# Patient Record
Sex: Female | Born: 1965
Health system: Southern US, Community
[De-identification: ages and names within clinical notes are randomized; demographics above are authoritative.]

## PROBLEM LIST (undated history)

## (undated) DIAGNOSIS — R200 Anesthesia of skin: Secondary | ICD-10-CM

## (undated) DIAGNOSIS — D649 Anemia, unspecified: Secondary | ICD-10-CM

## (undated) DIAGNOSIS — R519 Headache, unspecified: Secondary | ICD-10-CM

## (undated) DIAGNOSIS — J45909 Unspecified asthma, uncomplicated: Secondary | ICD-10-CM

## (undated) DIAGNOSIS — R232 Flushing: Secondary | ICD-10-CM

## (undated) DIAGNOSIS — N951 Menopausal and female climacteric states: Secondary | ICD-10-CM

## (undated) DIAGNOSIS — C439 Malignant melanoma of skin, unspecified: Secondary | ICD-10-CM

## (undated) DIAGNOSIS — N83209 Unspecified ovarian cyst, unspecified side: Secondary | ICD-10-CM

## (undated) DIAGNOSIS — R9389 Abnormal findings on diagnostic imaging of other specified body structures: Secondary | ICD-10-CM

## (undated) DIAGNOSIS — D219 Benign neoplasm of connective and other soft tissue, unspecified: Secondary | ICD-10-CM

## (undated) DIAGNOSIS — R51 Headache: Secondary | ICD-10-CM

## (undated) DIAGNOSIS — E7800 Pure hypercholesterolemia, unspecified: Secondary | ICD-10-CM

## (undated) DIAGNOSIS — N926 Irregular menstruation, unspecified: Secondary | ICD-10-CM

## (undated) DIAGNOSIS — J40 Bronchitis, not specified as acute or chronic: Secondary | ICD-10-CM

## (undated) DIAGNOSIS — R87810 Cervical high risk human papillomavirus (HPV) DNA test positive: Secondary | ICD-10-CM

## (undated) HISTORY — DX: Malignant melanoma of skin, unspecified: C43.9

## (undated) HISTORY — PX: TUBAL LIGATION: SHX77

## (undated) HISTORY — DX: Flushing: R23.2

## (undated) HISTORY — DX: Cervical high risk human papillomavirus (HPV) DNA test positive: R87.810

## (undated) HISTORY — DX: Pure hypercholesterolemia, unspecified: E78.00

## (undated) HISTORY — DX: Abnormal findings on diagnostic imaging of other specified body structures: R93.89

## (undated) HISTORY — DX: Benign neoplasm of connective and other soft tissue, unspecified: D21.9

## (undated) HISTORY — DX: Menopausal and female climacteric states: N95.1

## (undated) HISTORY — DX: Anemia, unspecified: D64.9

## (undated) HISTORY — DX: Irregular menstruation, unspecified: N92.6

## (undated) HISTORY — DX: Bronchitis, not specified as acute or chronic: J40

## (undated) HISTORY — DX: Unspecified ovarian cyst, unspecified side: N83.209

---

## 2008-12-16 ENCOUNTER — Emergency Department (HOSPITAL_COMMUNITY): Admission: EM | Admit: 2008-12-16 | Discharge: 2008-12-16 | Payer: Self-pay | Admitting: Emergency Medicine

## 2009-10-18 ENCOUNTER — Other Ambulatory Visit: Admission: RE | Admit: 2009-10-18 | Discharge: 2009-10-18 | Payer: Self-pay | Admitting: Obstetrics and Gynecology

## 2009-10-25 ENCOUNTER — Ambulatory Visit (HOSPITAL_COMMUNITY): Admission: RE | Admit: 2009-10-25 | Discharge: 2009-10-25 | Payer: Self-pay | Admitting: Obstetrics & Gynecology

## 2011-03-18 LAB — URINALYSIS, ROUTINE W REFLEX MICROSCOPIC
Bilirubin Urine: NEGATIVE
Glucose, UA: NEGATIVE mg/dL
Nitrite: POSITIVE — AB
pH: 6.5 (ref 5.0–8.0)

## 2011-03-18 LAB — URINE CULTURE

## 2012-12-02 HISTORY — PX: SKIN CANCER EXCISION: SHX779

## 2014-12-15 ENCOUNTER — Encounter: Payer: Self-pay | Admitting: Adult Health

## 2014-12-15 ENCOUNTER — Ambulatory Visit (INDEPENDENT_AMBULATORY_CARE_PROVIDER_SITE_OTHER): Payer: BLUE CROSS/BLUE SHIELD | Admitting: Adult Health

## 2014-12-15 ENCOUNTER — Other Ambulatory Visit (HOSPITAL_COMMUNITY)
Admission: RE | Admit: 2014-12-15 | Discharge: 2014-12-15 | Disposition: A | Discharge status: Home / Self Care | Payer: BLUE CROSS/BLUE SHIELD | Source: Ambulatory Visit | Attending: Adult Health | Admitting: Adult Health

## 2014-12-15 VITALS — BP 130/60 | HR 76 | Ht 65.25 in | Wt 150.5 lb

## 2014-12-15 DIAGNOSIS — N926 Irregular menstruation, unspecified: Secondary | ICD-10-CM

## 2014-12-15 DIAGNOSIS — R232 Flushing: Secondary | ICD-10-CM

## 2014-12-15 DIAGNOSIS — Z01419 Encounter for gynecological examination (general) (routine) without abnormal findings: Secondary | ICD-10-CM

## 2014-12-15 DIAGNOSIS — Z1151 Encounter for screening for human papillomavirus (HPV): Secondary | ICD-10-CM | POA: Insufficient documentation

## 2014-12-15 DIAGNOSIS — Z1212 Encounter for screening for malignant neoplasm of rectum: Secondary | ICD-10-CM

## 2014-12-15 DIAGNOSIS — N951 Menopausal and female climacteric states: Secondary | ICD-10-CM

## 2014-12-15 DIAGNOSIS — R635 Abnormal weight gain: Secondary | ICD-10-CM | POA: Insufficient documentation

## 2014-12-15 HISTORY — DX: Irregular menstruation, unspecified: N92.6

## 2014-12-15 HISTORY — DX: Menopausal and female climacteric states: N95.1

## 2014-12-15 HISTORY — DX: Flushing: R23.2

## 2014-12-15 LAB — HEMOCCULT GUIAC POC 1CARD (OFFICE): Fecal Occult Blood, POC: NEGATIVE

## 2014-12-15 NOTE — Patient Instructions (Signed)
Get mammogram [phone] Physical in 1 year Colonoscopy advised at 49 Menopause Menopause is the normal time of life when menstrual periods stop completely. Menopause is complete when you have missed 12 consecutive menstrual periods. It usually occurs between the ages of 50 years and 66 years. Very rarely does a woman develop menopause before the age of 50 years. At menopause, your ovaries stop producing the female hormones estrogen and progesterone. This can cause undesirable symptoms and also affect your health. Sometimes the symptoms may occur 4-5 years before the menopause begins. There is no relationship between menopause and:  Oral contraceptives.  Number of children you had.  Race.  The age your menstrual periods started (menarche). Heavy smokers and very thin women may develop menopause earlier in life. CAUSES  The ovaries stop producing the female hormones estrogen and progesterone.  Other causes include:  Surgery to remove both ovaries.  The ovaries stop functioning for no known reason.  Tumors of the pituitary gland in the brain.  Medical disease that affects the ovaries and hormone production.  Radiation treatment to the abdomen or pelvis.  Chemotherapy that affects the ovaries. SYMPTOMS   Hot flashes.  Night sweats.  Decrease in sex drive.  Vaginal dryness and thinning of the vagina causing painful intercourse.  Dryness of the skin and developing wrinkles.  Headaches.  Tiredness.  Irritability.  Memory problems.  Weight gain.  Bladder infections.  Hair growth of the face and chest.  Infertility. More serious symptoms include:  Loss of bone (osteoporosis) causing breaks (fractures).  Depression.  Hardening and narrowing of the arteries (atherosclerosis) causing heart attacks and strokes. DIAGNOSIS   When the menstrual periods have stopped for 12 straight months.  Physical exam.  Hormone studies of the blood. TREATMENT  There are many  treatment choices and nearly as many questions about them. The decisions to treat or not to treat menopausal changes is an individual choice made with your health care provider. Your health care provider can discuss the treatments with you. Together, you can decide which treatment will work best for you. Your treatment choices may include:   Hormone therapy (estrogen and progesterone).  Non-hormonal medicines.  Treating the individual symptoms with medicine (for example antidepressants for depression).  Herbal medicines that may help specific symptoms.  Counseling by a psychiatrist or psychologist.  Group therapy.  Lifestyle changes including:  Eating healthy.  Regular exercise.  Limiting caffeine and alcohol.  Stress management and meditation.  No treatment. HOME CARE INSTRUCTIONS   Take the medicine your health care provider gives you as directed.  Get plenty of sleep and rest.  Exercise regularly.  Eat a diet that contains calcium (good for the bones) and soy products (acts like estrogen hormone).  Avoid alcoholic beverages.  Do not smoke.  If you have hot flashes, dress in layers.  Take supplements, calcium, and vitamin D to strengthen bones.  You can use over-the-counter lubricants or moisturizers for vaginal dryness.  Group therapy is sometimes very helpful.  Acupuncture may be helpful in some cases. SEEK MEDICAL CARE IF:   You are not sure you are in menopause.  You are having menopausal symptoms and need advice and treatment.  You are still having menstrual periods after age 50 years.  You have pain with intercourse.  Menopause is complete (no menstrual period for 12 months) and you develop vaginal bleeding.  You need a referral to a specialist (gynecologist, psychiatrist, or psychologist) for treatment. SEEK IMMEDIATE MEDICAL CARE IF:  You have severe depression.  You have excessive vaginal bleeding.  You fell and think you have a broken  bone.  You have pain when you urinate.  You develop leg or chest pain.  You have a fast pounding heart beat (palpitations).  You have severe headaches.  You develop vision problems.  You feel a lump in your breast.  You have abdominal pain or severe indigestion. Document Released: 02/08/2004 Document Revised: 07/21/2013 Document Reviewed: 06/17/2013 [facility] Patient Information 2015 [place], [place]. This information is not intended to replace advice given to you by your health care provider. Make sure you discuss any questions you have with your health care provider. Perimenopause Perimenopause is the time when your body begins to move into the menopause (no menstrual period for 12 straight months). It is a natural process. Perimenopause can begin 2-8 years before the menopause and usually lasts for 1 year after the menopause. During this time, your ovaries may or may not produce an egg. The ovaries vary in their production of estrogen and progesterone hormones each month. This can cause irregular menstrual periods, difficulty getting pregnant, vaginal bleeding between periods, and uncomfortable symptoms. CAUSES  Irregular production of the ovarian hormones, estrogen and progesterone, and not ovulating every month.  Other causes include:  Tumor of the pituitary gland in the brain.  Medical disease that affects the ovaries.  Radiation treatment.  Chemotherapy.  Unknown causes.  Heavy smoking and excessive alcohol intake can bring on perimenopause sooner. SIGNS AND SYMPTOMS   Hot flashes.  Night sweats.  Irregular menstrual periods.  Decreased sex drive.  Vaginal dryness.  Headaches.  Mood swings.  Depression.  Memory problems.  Irritability.  Tiredness.  Weight gain.  Trouble getting pregnant.  The beginning of losing bone cells (osteoporosis).  The beginning of hardening of the arteries (atherosclerosis). DIAGNOSIS  Your health care provider will  make a diagnosis by analyzing your age, menstrual history, and symptoms. He or she will do a physical exam and note any changes in your body, especially your female organs. Female hormone tests may or may not be helpful depending on the amount of female hormones you produce and when you produce them. However, other hormone tests may be helpful to rule out other problems. TREATMENT  In some cases, no treatment is needed. The decision on whether treatment is necessary during the perimenopause should be made by you and your health care provider based on how the symptoms are affecting you and your lifestyle. Various treatments are available, such as:  Treating individual symptoms with a specific medicine for that symptom.  Herbal medicines that can help specific symptoms.  Counseling.  Group therapy. HOME CARE INSTRUCTIONS   Keep track of your menstrual periods (when they occur, how heavy they are, how long between periods, and how long they last) as well as your symptoms and when they started.  Only take over-the-counter or prescription medicines as directed by your health care provider.  Sleep and rest.  Exercise.  Eat a diet that contains calcium (good for your bones) and soy (acts like the estrogen hormone).  Do not smoke.  Avoid alcoholic beverages.  Take vitamin supplements as recommended by your health care provider. Taking vitamin E may help in certain cases.  Take calcium and vitamin D supplements to help prevent bone loss.  Group therapy is sometimes helpful.  Acupuncture may help in some cases. SEEK MEDICAL CARE IF:   You have questions about any symptoms you are having.  You need  a referral to a specialist (gynecologist, psychiatrist, or psychologist). SEEK IMMEDIATE MEDICAL CARE IF:   You have vaginal bleeding.  Your period lasts longer than 8 days.  Your periods are recurring sooner than 21 days.  You have bleeding after intercourse.  You have severe  depression.  You have pain when you urinate.  You have severe headaches.  You have vision problems. Document Released: 12/26/2004 Document Revised: 09/08/2013 Document Reviewed: 06/17/2013 [facility] Patient Information 2015 [place], [place]. This information is not intended to replace advice given to you by your health care provider. Make sure you discuss any questions you have with your health care provider.

## 2014-12-15 NOTE — Progress Notes (Signed)
Patient ID: Nancy Barrett, female   DOB: [birth year], 49 y.o.   MRN: [id number] History of Present Illness: Nancy Barrett is a 49 year old white female, married in for pap and physical and is complaining of hot flashes,weight gain, not sleeping well, some constipation and periods are irregular, will skip and then its heavy with clots.She has headaches more now.   Current Medications, Allergies, Past Medical History, Past Surgical History, Family History and Social History were reviewed in [employer] record.     Review of Systems: Patient denies any  blurred vision, shortness of breath, chest pain, abdominal pain, problems with  urination, or intercourse. 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    Physical Exam:BP 130/60 mmHg  Pulse 76  Ht 5' 5.25" (1.657 m)  Wt 150 lb 8 oz (68.266 kg)  BMI 24.86 kg/m2  LMP 11/21/2014 General:  Well developed, well nourished, no acute distress Skin:  Warm and dry Neck:  Midline trachea, normal thyroid Lungs; Clear to auscultation bilaterally Breast:  No dominant palpable mass, retraction, or nipple discharge,has bilateral irregularities Cardiovascular: Regular rate and rhythm Abdomen:  Soft, non tender, no hepatosplenomegaly Pelvic:  External genitalia is normal in appearance, no lesions.  The vagina is normal in appearance.      The cervix is bulbous, pap with HPV performed.  Uterus is felt to be normal size, shape, and contour.  No  adnexal masses or tenderness noted. Rectal: Good sphincter tone, no polyps, or hemorrhoids felt.  Hemoccult negative. Extremities:  No swelling, some spider veins  noted Psych:  No mood changes,alert and cooperative,seems happy Discussed peri menopause and menopause symptoms.  Impression: Well woman gyn exam with pap Hot flashes Weight gain Irregular periods Peri  menopause    Plan: Check CBC,CMP,TSH,FSH and lipids Return in 1 week for [place] and see me, will review [place] and labs then and discuss options Get mammogram Colonoscopy advised Physical in 1 year Review handout on peri menopause and menopause

## 2014-12-16 LAB — COMPREHENSIVE METABOLIC PANEL
ALT: 11 U/L (ref 0–35)
AST: 17 U/L (ref 0–37)
Albumin: 4.1 g/dL (ref 3.5–5.2)
Alkaline Phosphatase: 81 U/L (ref 39–117)
BILIRUBIN TOTAL: 0.4 mg/dL (ref 0.2–1.2)
BUN: 13 mg/dL (ref 6–23)
CHLORIDE: 103 meq/L (ref 96–112)
CO2: 29 mEq/L (ref 19–32)
Calcium: 10.7 mg/dL — ABNORMAL HIGH (ref 8.4–10.5)
Creat: 0.62 mg/dL (ref 0.50–1.10)
GLUCOSE: 86 mg/dL (ref 70–99)
Potassium: 4.7 mEq/L (ref 3.5–5.3)
SODIUM: 137 meq/L (ref 135–145)
TOTAL PROTEIN: 6.6 g/dL (ref 6.0–8.3)

## 2014-12-16 LAB — LIPID PANEL
CHOLESTEROL: 194 mg/dL (ref 0–200)
HDL: 60 mg/dL (ref >39)
LDL Cholesterol: 123 mg/dL — ABNORMAL HIGH (ref 0–99)
Total CHOL/HDL Ratio: 3.2 Ratio
Triglycerides: 55 mg/dL (ref <150)
VLDL: 11 mg/dL (ref 0–40)

## 2014-12-16 LAB — CBC
HCT: 35.3 % — ABNORMAL LOW (ref 36.0–46.0)
HEMOGLOBIN: 11.4 g/dL — AB (ref 12.0–15.0)
MCH: 26.3 pg (ref 26.0–34.0)
MCHC: 32.3 g/dL (ref 30.0–36.0)
MCV: 81.5 fL (ref 78.0–100.0)
MPV: 10.6 fL (ref 8.6–12.4)
PLATELETS: 237 10*3/uL (ref 150–400)
RBC: 4.33 MIL/uL (ref 3.87–5.11)
RDW: 13.2 % (ref 11.5–15.5)
WBC: 6.1 10*3/uL (ref 4.0–10.5)

## 2014-12-16 LAB — FOLLICLE STIMULATING HORMONE: FSH: 11.1 m[IU]/mL

## 2014-12-16 LAB — TSH: TSH: 2.453 u[IU]/mL (ref 0.350–4.500)

## 2014-12-19 ENCOUNTER — Other Ambulatory Visit: Payer: Self-pay | Admitting: Obstetrics and Gynecology

## 2014-12-19 ENCOUNTER — Telehealth: Payer: Self-pay | Admitting: Adult Health

## 2014-12-19 DIAGNOSIS — Z1231 Encounter for screening mammogram for malignant neoplasm of breast: Secondary | ICD-10-CM

## 2014-12-19 LAB — CYTOLOGY - PAP

## 2014-12-19 NOTE — Telephone Encounter (Signed)
Pt aware of  Labs and pap, take MN with iron keep app 1/25 for [place] and see me

## 2014-12-19 NOTE — Telephone Encounter (Signed)
Opened in error

## 2014-12-26 ENCOUNTER — Ambulatory Visit (INDEPENDENT_AMBULATORY_CARE_PROVIDER_SITE_OTHER): Payer: BLUE CROSS/BLUE SHIELD | Admitting: Adult Health

## 2014-12-26 ENCOUNTER — Other Ambulatory Visit: Payer: Self-pay | Admitting: Adult Health

## 2014-12-26 ENCOUNTER — Encounter: Payer: Self-pay | Admitting: Adult Health

## 2014-12-26 ENCOUNTER — Ambulatory Visit (INDEPENDENT_AMBULATORY_CARE_PROVIDER_SITE_OTHER): Payer: BLUE CROSS/BLUE SHIELD

## 2014-12-26 VITALS — BP 148/90 | Ht 65.25 in | Wt 150.0 lb

## 2014-12-26 DIAGNOSIS — D259 Leiomyoma of uterus, unspecified: Secondary | ICD-10-CM

## 2014-12-26 DIAGNOSIS — N921 Excessive and frequent menstruation with irregular cycle: Secondary | ICD-10-CM

## 2014-12-26 DIAGNOSIS — N83201 Unspecified ovarian cyst, right side: Secondary | ICD-10-CM

## 2014-12-26 DIAGNOSIS — N8320 Unspecified ovarian cysts: Secondary | ICD-10-CM

## 2014-12-26 DIAGNOSIS — N83209 Unspecified ovarian cyst, unspecified side: Secondary | ICD-10-CM

## 2014-12-26 DIAGNOSIS — N926 Irregular menstruation, unspecified: Secondary | ICD-10-CM

## 2014-12-26 DIAGNOSIS — N951 Menopausal and female climacteric states: Secondary | ICD-10-CM

## 2014-12-26 DIAGNOSIS — D219 Benign neoplasm of connective and other soft tissue, unspecified: Secondary | ICD-10-CM | POA: Insufficient documentation

## 2014-12-26 DIAGNOSIS — N83202 Unspecified ovarian cyst, left side: Secondary | ICD-10-CM

## 2014-12-26 HISTORY — DX: Benign neoplasm of connective and other soft tissue, unspecified: D21.9

## 2014-12-26 HISTORY — DX: Unspecified ovarian cyst, unspecified side: N83.209

## 2014-12-26 NOTE — Patient Instructions (Signed)
Ovarian Cyst An ovarian cyst is a fluid-filled sac that forms on an ovary. The ovaries are small organs that produce eggs in women. Various types of cysts can form on the ovaries. Most are not cancerous. Many do not cause problems, and they often go away on their own. Some may cause symptoms and require treatment. Common types of ovarian cysts include:  Functional cysts--These cysts may occur every month during the menstrual cycle. This is normal. The cysts usually go away with the next menstrual cycle if the woman does not get pregnant. Usually, there are no symptoms with a functional cyst.  Endometrioma cysts--These cysts form from the tissue that lines the uterus. They are also called "chocolate cysts" because they become filled with blood that turns brown. This type of cyst can cause pain in the lower abdomen during intercourse and with your menstrual period.  Cystadenoma cysts--This type develops from the cells on the outside of the ovary. These cysts can get very big and cause lower abdomen pain and pain with intercourse. This type of cyst can twist on itself, cut off its blood supply, and cause severe pain. It can also easily rupture and cause a lot of pain.  Dermoid cysts--This type of cyst is sometimes found in both ovaries. These cysts may contain different kinds of body tissue, such as skin, teeth, hair, or cartilage. They usually do not cause symptoms unless they get very big.  Theca lutein cysts--These cysts occur when too much of a certain hormone (human chorionic gonadotropin) is produced and overstimulates the ovaries to produce an egg. This is most common after procedures used to assist with the conception of a baby (in vitro fertilization). CAUSES   Fertility drugs can cause a condition in which multiple large cysts are formed on the ovaries. This is called ovarian hyperstimulation syndrome.  A condition called polycystic ovary syndrome can cause hormonal imbalances that can lead to  nonfunctional ovarian cysts. SIGNS AND SYMPTOMS  Many ovarian cysts do not cause symptoms. If symptoms are present, they may include:  Pelvic pain or pressure.  Pain in the lower abdomen.  Pain during sexual intercourse.  Increasing girth (swelling) of the abdomen.  Abnormal menstrual periods.  Increasing pain with menstrual periods.  Stopping having menstrual periods without being pregnant. DIAGNOSIS  These cysts are commonly found during a routine or annual pelvic exam. Tests may be ordered to find out more about the cyst. These tests may include:  Ultrasound.  X-ray of the pelvis.  CT scan.  MRI.  Blood tests. TREATMENT  Many ovarian cysts go away on their own without treatment. Your health care provider may want to check your cyst regularly for 2-3 months to see if it changes. For women in menopause, it is particularly important to monitor a cyst closely because of the higher rate of ovarian cancer in menopausal women. When treatment is needed, it may include any of the following:  A procedure to drain the cyst (aspiration). This may be done using a long needle and ultrasound. It can also be done through a laparoscopic procedure. This involves using a thin, lighted tube with a tiny camera on the end (laparoscope) inserted through a small incision.  Surgery to remove the whole cyst. This may be done using laparoscopic surgery or an open surgery involving a larger incision in the lower abdomen.  Hormone treatment or birth control pills. These methods are sometimes used to help dissolve a cyst. HOME CARE INSTRUCTIONS   Only take over-the-counter   or prescription medicines as directed by your health care provider.  Follow up with your health care provider as directed.  Get regular pelvic exams and Pap tests. SEEK MEDICAL CARE IF:   Your periods are late, irregular, or painful, or they stop.  Your pelvic pain or abdominal pain does not go away.  Your abdomen becomes  larger or swollen.  You have pressure on your bladder or trouble emptying your bladder completely.  You have pain during sexual intercourse.  You have feelings of fullness, pressure, or discomfort in your stomach.  You lose weight for no apparent reason.  You feel generally ill.  You become constipated.  You lose your appetite.  You develop acne.  You have an increase in body and facial hair.  You are gaining weight, without changing your exercise and eating habits.  You think you are pregnant. SEEK IMMEDIATE MEDICAL CARE IF:   You have increasing abdominal pain.  You feel sick to your stomach (nauseous), and you throw up (vomit).  You develop a fever that comes on suddenly.  You have abdominal pain during a bowel movement.  Your menstrual periods become heavier than usual. MAKE SURE YOU:  Understand these instructions.  Will watch your condition.  Will get help right away if you are not doing well or get worse. Document Released: 11/18/2005 Document Revised: 11/23/2013 Document Reviewed: 07/26/2013 [facility] Patient Information 2015 [place], [place]. This information is not intended to replace advice given to you by your health care provider. Make sure you discuss any questions you have with your health care provider. Uterine Fibroid A uterine fibroid is a growth (tumor) that occurs in your uterus. This type of tumor is not cancerous and does not spread out of the uterus. You can have one or many fibroids. Fibroids can vary in size, weight, and where they grow in the uterus. Some can become quite large. Most fibroids do not require medical treatment, but some can cause pain or heavy bleeding during and between periods. CAUSES  A fibroid is the result of a single uterine cell that keeps growing (unregulated), which is different than most cells in the human body. Most cells have a control mechanism that keeps them from reproducing without control.  SIGNS AND SYMPTOMS    Bleeding.  Pelvic pain and pressure.  Bladder problems due to the size of the fibroid.  Infertility and miscarriages depending on the size and location of the fibroid. DIAGNOSIS  Uterine fibroids are diagnosed through a physical exam. Your health care provider may feel the lumpy tumors during a pelvic exam. Ultrasonography may be done to get information regarding size, location, and number of tumors.  TREATMENT   Your health care provider may recommend watchful waiting. This involves getting the fibroid checked by your health care provider to see if it grows or shrinks.   Hormone treatment or an intrauterine device (IUD) may be prescribed.   Surgery may be needed to remove the fibroids (myomectomy) or the uterus (hysterectomy). This depends on your situation. When fibroids interfere with fertility and a woman wants to become pregnant, a health care provider may recommend having the fibroids removed.  [place] care depends on how you were treated. In general:   Keep all follow-up appointments with your health care provider.   Only take over-the-counter or prescription medicines as directed by your health care provider. If you were prescribed a hormone treatment, take the hormone medicines exactly as directed. Do not take aspirin. It  can cause bleeding.   Talk to your health care provider about taking iron pills.  If your periods are troublesome but not so heavy, lie down with your feet raised slightly above your heart. Place cold packs on your lower abdomen.   If your periods are heavy, write down the number of pads or tampons you use per month. Bring this information to your health care provider.   Include green vegetables in your diet.  SEEK IMMEDIATE MEDICAL CARE IF:  You have pelvic pain or cramps not controlled with medicines.   You have a sudden increase in pelvic pain.   You have an increase in bleeding between and during periods.   You  have excessive periods and soak tampons or pads in a half hour or less.  You feel lightheaded or have fainting episodes. Document Released: 11/15/2000 Document Revised: 09/08/2013 Document Reviewed: 06/17/2013 [facility] Patient Information 2015 [place], [place]. This information is not intended to replace advice given to you by your health care provider. Make sure you discuss any questions you have with your health care provider. Follow up prn

## 2014-12-26 NOTE — Progress Notes (Signed)
Subjective:     Patient ID: Nancy Barrett, female   DOB: 15-Jul-1966, 49 y.o.   MRN: [id number]  HPI Nancy Barrett is a 49 year old white female in for [place] for irregular periods.  Review of Systems See HPI Reviewed past medical,surgical, social and family history. Reviewed medications and allergies.     Objective:   Physical Exam BP 148/90 mmHg  Ht 5' 5.25" (1.657 m)  Wt 150 lb (68.04 kg)  BMI 24.78 kg/m2  LMP 11/21/2014 [place] reviewed with pt and Dr Glo Herring   Uterus 11.5 x 7.0 x 8.5 cm, with multiple fibroids noted largest= 2.7cm  Endometrium 9.9 mm, distorted by fibroids  Right ovary 3.4 x 3.2 x 2.0 cm, with 2.0 x 1.9cm cystic area noted with small amount of internal echoes noted   Left ovary 4.3 x 3.5 x 2.9 cm, with 3.4 x 2.3cm cystic (simple) area noted  No free fluid noted within the pelvis   Technician Comments:  Anteverted uterus with multiple fibroids noted, Endometrium distorted by fibroids, Rt ovary with complex cyst noted, Lt ovary with simple cyst noted, no free fluid noted  Discussed that since [facility] 10 not menopausal yet,and that periods may become more irregular, and that cysts on ovaries stable since 2010, will continue to follow.  Assessment:     Irregular periods Fibroids Bilateral ovarian cysts Peri menopause    Plan:     Review handouts on fibroids and ovarian cysts Follow up prn

## 2014-12-30 ENCOUNTER — Ambulatory Visit (HOSPITAL_COMMUNITY)
Admission: RE | Admit: 2014-12-30 | Discharge: 2014-12-30 | Disposition: A | Discharge status: Home / Self Care | Payer: BLUE CROSS/BLUE SHIELD | Source: Ambulatory Visit | Attending: Obstetrics and Gynecology | Admitting: Obstetrics and Gynecology

## 2014-12-30 DIAGNOSIS — Z1231 Encounter for screening mammogram for malignant neoplasm of breast: Secondary | ICD-10-CM

## 2015-01-05 ENCOUNTER — Telehealth: Payer: Self-pay | Admitting: Adult Health

## 2015-01-05 NOTE — Telephone Encounter (Signed)
Pt aware needs F/U US in 3 months, put in recall

## 2015-01-06 ENCOUNTER — Telehealth: Payer: Self-pay | Admitting: Adult Health

## 2015-01-06 NOTE — Telephone Encounter (Signed)
Pt wanted result so mammogram it was negative

## 2015-03-29 ENCOUNTER — Other Ambulatory Visit: Payer: Self-pay | Admitting: Obstetrics & Gynecology

## 2015-03-29 DIAGNOSIS — D259 Leiomyoma of uterus, unspecified: Secondary | ICD-10-CM

## 2015-03-31 ENCOUNTER — Encounter: Payer: Self-pay | Admitting: Adult Health

## 2015-03-31 ENCOUNTER — Ambulatory Visit (INDEPENDENT_AMBULATORY_CARE_PROVIDER_SITE_OTHER): Payer: BLUE CROSS/BLUE SHIELD | Admitting: Adult Health

## 2015-03-31 ENCOUNTER — Ambulatory Visit (INDEPENDENT_AMBULATORY_CARE_PROVIDER_SITE_OTHER): Payer: BLUE CROSS/BLUE SHIELD

## 2015-03-31 VITALS — BP 130/80 | HR 60 | Ht 67.0 in | Wt 148.5 lb

## 2015-03-31 DIAGNOSIS — D259 Leiomyoma of uterus, unspecified: Secondary | ICD-10-CM

## 2015-03-31 DIAGNOSIS — N926 Irregular menstruation, unspecified: Secondary | ICD-10-CM | POA: Diagnosis not present

## 2015-03-31 DIAGNOSIS — N951 Menopausal and female climacteric states: Secondary | ICD-10-CM | POA: Diagnosis not present

## 2015-03-31 NOTE — Patient Instructions (Signed)
Follow up prn  Physical in 1 year Colonoscopy advised

## 2015-03-31 NOTE — Progress Notes (Signed)
Subjective:     Patient ID: Nancy Barrett, female   DOB: 1965/12/05, 49 y.o.   MRN: [id number]  HPI Nancy Barrett is a 49 year old white female, married back in follow up of ovarian cysts and irregular periods.She had [place] today and cysts have resolved.Has some pain RLQ after sex.  Review of Systems Irregular periods, pain after sex.All other systems negative.  Reviewed past medical,surgical, social and family history. Reviewed medications and allergies.     Objective:   Physical Exam BP 130/80 mmHg  Pulse 60  Ht 5\' 7"  (1.702 m)  Wt 148 lb 8 oz (67.359 kg)  BMI 23.25 kg/m2  LMP 01/25/2015 (Within Days)  BP was 150/90 on arrival was nervous.Skin warm and dry, abdomen soft, non tender, no HSM, no RLQ pain with palpation today.   Reviewed [place] results:   Uterus 13.1 x 7.1 x 9.6 cm, mult fibroids (#1) post bdy 3.9 x 2.6 x 3.7 cm , (#2) ant bdy 2.5 x 2.1 x 2.7( #3) fundal 1.8x 1.8 x 2.3cm   Endometrium 14.4 mm, symmetrical, wnl  Right ovary 3.2 x 2.6 x 3.5 cm, wnl  Left ovary 4.1 x 2.7 x 2.9 cm, wnl    Technician Comments:  [place] TA/TV pelvis, anteverted uterus w/mult.fibroids, (#1) post bdy 3.9 x 2.6 x 3.7cm,(#2)2.5 x 2.1 x 2.7cm ant bdy,(#3) fundal 1.8 x 1.8 x 2.3 cm,normal ov's bilat  Discussed periods will get more irregular then stop and that after menopause fibroids will shrink.Use pillows under hips with sex and try withdrawal to see if semen making her contract.She is aware cysts have resolved.     Assessment:     Irregular periods Fibroids Peri menopause    Plan:     Follow up prn Physical in 1 year Colonoscopy advised

## 2015-03-31 NOTE — Progress Notes (Signed)
[place] TA/TV pelvis, anteverted uterus w/mult.fibroids, (#1) post bdy 3.9 x 2.6 x 3.7cm,(#2)2.5 x 2.1 x 2.7cm ant bdy,(#3) fundal 1.8 x 1.8 x 2.3 cm,normal ov's bilat

## 2015-10-25 ENCOUNTER — Encounter (HOSPITAL_COMMUNITY): Payer: Self-pay | Admitting: Emergency Medicine

## 2015-10-25 ENCOUNTER — Emergency Department (HOSPITAL_COMMUNITY)
Admission: EM | Admit: 2015-10-25 | Discharge: 2015-10-26 | Disposition: A | Discharge status: Home / Self Care | Payer: BLUE CROSS/BLUE SHIELD | Attending: Emergency Medicine | Admitting: Emergency Medicine

## 2015-10-25 ENCOUNTER — Emergency Department (HOSPITAL_COMMUNITY): Payer: BLUE CROSS/BLUE SHIELD

## 2015-10-25 DIAGNOSIS — R0602 Shortness of breath: Secondary | ICD-10-CM | POA: Diagnosis present

## 2015-10-25 DIAGNOSIS — Z880 Allergy status to penicillin: Secondary | ICD-10-CM | POA: Diagnosis not present

## 2015-10-25 DIAGNOSIS — J209 Acute bronchitis, unspecified: Secondary | ICD-10-CM | POA: Diagnosis not present

## 2015-10-25 DIAGNOSIS — H578 Other specified disorders of eye and adnexa: Secondary | ICD-10-CM | POA: Diagnosis not present

## 2015-10-25 DIAGNOSIS — Z79899 Other long term (current) drug therapy: Secondary | ICD-10-CM | POA: Diagnosis not present

## 2015-10-25 DIAGNOSIS — J0100 Acute maxillary sinusitis, unspecified: Secondary | ICD-10-CM | POA: Insufficient documentation

## 2015-10-25 DIAGNOSIS — Z792 Long term (current) use of antibiotics: Secondary | ICD-10-CM | POA: Diagnosis not present

## 2015-10-25 DIAGNOSIS — Z862 Personal history of diseases of the blood and blood-forming organs and certain disorders involving the immune mechanism: Secondary | ICD-10-CM | POA: Insufficient documentation

## 2015-10-25 DIAGNOSIS — Z86018 Personal history of other benign neoplasm: Secondary | ICD-10-CM | POA: Insufficient documentation

## 2015-10-25 DIAGNOSIS — Z8742 Personal history of other diseases of the female genital tract: Secondary | ICD-10-CM | POA: Diagnosis not present

## 2015-10-25 LAB — CBC
HCT: 41.9 % (ref 36.0–46.0)
Hemoglobin: 14.1 g/dL (ref 12.0–15.0)
MCH: 30.5 pg (ref 26.0–34.0)
MCHC: 33.7 g/dL (ref 30.0–36.0)
MCV: 90.7 fL (ref 78.0–100.0)
PLATELETS: 181 10*3/uL (ref 150–400)
RBC: 4.62 MIL/uL (ref 3.87–5.11)
RDW: 12.6 % (ref 11.5–15.5)
WBC: 6.8 10*3/uL (ref 4.0–10.5)

## 2015-10-25 LAB — BASIC METABOLIC PANEL
Anion gap: 6 (ref 5–15)
BUN: 12 mg/dL (ref 6–20)
CALCIUM: 10.6 mg/dL — AB (ref 8.9–10.3)
CO2: 31 mmol/L (ref 22–32)
CREATININE: 0.72 mg/dL (ref 0.44–1.00)
Chloride: 101 mmol/L (ref 101–111)
GFR calc Af Amer: >60 mL/min (ref >60)
GFR calc non Af Amer: >60 mL/min (ref >60)
GLUCOSE: 97 mg/dL (ref 65–99)
Potassium: 4.4 mmol/L (ref 3.5–5.1)
Sodium: 138 mmol/L (ref 135–145)

## 2015-10-25 LAB — TROPONIN I

## 2015-10-25 NOTE — ED Notes (Signed)
Patient complaining of shortness of breath and chest pain off and on for over one month. States chest pain is mid center and radiates to left side.

## 2015-10-26 MED ORDER — AZITHROMYCIN 250 MG PO TABS
500.0000 mg | ORAL_TABLET | Freq: Once | ORAL | Status: AC
  Administered 2015-10-26: 500 mg via ORAL
  Filled 2015-10-26: qty 2

## 2015-10-26 MED ORDER — ALBUTEROL SULFATE HFA 108 (90 BASE) MCG/ACT IN AERS
2.0000 | INHALATION_SPRAY | Freq: Once | RESPIRATORY_TRACT | Status: AC
  Administered 2015-10-26: 2 via RESPIRATORY_TRACT
  Filled 2015-10-26: qty 6.7

## 2015-10-26 MED ORDER — AZITHROMYCIN 250 MG PO TABS: ORAL_TABLET | ORAL | Status: DC

## 2015-10-26 NOTE — ED Provider Notes (Signed)
CSN: [id number]     Arrival date & time 10/25/15  2234 History   First MD Initiated Contact with Patient 10/25/15 2253     Chief Complaint  Patient presents with  . Shortness of Breath  . Chest Pain     (Consider location/radiation/quality/duration/timing/severity/associated sxs/prior Treatment) The history is provided by the patient and the spouse.    Nancy Barrett is a 49 y.o. female with history as outlined below presenting with a one-month history of intermittent URI type symptoms including nasal congestion along with sinus pain and pressure accompanied by headaches.  Additionally she endorses itching and watery eyes, cough which has been productive of a thick green sputum along with similar nasal discharge.  She also endorses mid sternal chest pain which is described as pressure, radiates into her left chest and is triggered by coughing.  She reports shortness of which seems worse when supine.  She denies wheezing.  Has been at bedside mentions that she works with him in [employer], with the weather being so dry she has had lots of exposure to dust and debris.  She is not currently on any allergy medications.  She has been using a heating pad to her sinuses and using vicks vapor rub with transient improvement in facial pain and pressure.     Past Medical History  Diagnosis Date  . Hot flashes 12/15/2014  . Irregular periods 12/15/2014  . Peri-menopause 12/15/2014  . Fibroids 12/26/2014  . Ovarian cyst 12/26/2014  . Anemia    Past Surgical History  Procedure Laterality Date  . Skin cancer excision  2014    removed from nose  . Tubal ligation     Family History  Problem Relation Age of Onset  . Bronchitis Mother   . Arthritis Mother   . Heart attack Father   . Heart attack Paternal Uncle   . Other Maternal Grandfather     stomach problems  . Heart attack Paternal Grandmother   . Stroke Paternal Grandfather   . Hypertension Brother   . Hypertension Brother   .  Hypertension Brother    Social History  Substance Use Topics  . Smoking status: Never Smoker   . Smokeless tobacco: Never Used  . Alcohol Use: No   OB History    Gravida Para Term Preterm AB TAB SAB Ectopic Multiple Living   3 3        3      Review of Systems  Constitutional: Negative for fever and chills.  HENT: Positive for congestion, rhinorrhea and sinus pressure. Negative for ear pain, sore throat, trouble swallowing and voice change.   Eyes: Negative for discharge.  Respiratory: Positive for cough, chest tightness and shortness of breath. Negative for wheezing and stridor.   Cardiovascular: Negative for chest pain.  Gastrointestinal: Negative for nausea, vomiting and abdominal pain.  Genitourinary: Negative.   Skin: Negative.       Allergies  Amoxicillin and Penicillins  Home Medications   Prior to Admission medications   Medication Sig Start Date End Date Taking? Authorizing Provider  azithromycin (ZITHROMAX Z-PAK) 250 MG tablet 1 tab PO daily for 4 days 10/26/15   Evalee Jefferson, PA-C  IRON PO Take by mouth daily.    Historical Provider, MD  Multiple Vitamins-Calcium (ONE-A-DAY WOMENS PO) Take by mouth daily.    Historical Provider, MD   BP 132/80 mmHg  Pulse 70  Temp(Src) 97.3 F (36.3 C) (Oral)  Resp 17  Ht 5\' 7"  (1.702 m)  Wt  63.504 kg  BMI 21.92 kg/m2  SpO2 98%  LMP 09/21/2015 Physical Exam  Constitutional: She appears well-developed and well-nourished.  HENT:  Head: Normocephalic and atraumatic.  Right Ear: Tympanic membrane and ear canal normal.  Left Ear: Tympanic membrane and ear canal normal.  Nose: Mucosal edema present. Left sinus exhibits maxillary sinus tenderness.  Mouth/Throat: Uvula is midline, oropharynx is clear and moist and mucous membranes are normal.  Eyes: Conjunctivae are normal.  Neck: Normal range of motion.  Cardiovascular: Normal rate, regular rhythm, normal heart sounds and intact distal pulses.   Pulmonary/Chest: Effort  normal and breath sounds normal. She has no wheezes.  Abdominal: Soft. Bowel sounds are normal. There is no tenderness.  Musculoskeletal: Normal range of motion. She exhibits no edema or tenderness.  Neurological: She is alert.  Skin: Skin is warm and dry.  Psychiatric: She has a normal mood and affect.  Nursing note and vitals reviewed.   ED Course  Procedures (including critical care time) Labs Review Labs Reviewed  BASIC METABOLIC PANEL - Abnormal; Notable for the following:    Calcium 10.6 (*)    All other components within normal limits  CBC  TROPONIN I    Imaging Review Dg Chest 2 View  10/25/2015  CLINICAL DATA:  Shortness of breath and chest pain off and on for 1 month. Central chest pain radiating to the left. Shortness of breath. EXAM: CHEST  2 VIEW COMPARISON:  None. FINDINGS: Mild hyperinflation. Normal heart size and pulmonary vascularity. No focal airspace disease or consolidation in the lungs. No blunting of costophrenic angles. No pneumothorax. Mediastinal contours appear intact. IMPRESSION: No active cardiopulmonary disease. Electronically Signed   By: Lucienne Capers M.D.   On: 10/25/2015 23:56   I have personally reviewed and evaluated these images and lab results as part of my medical decision-making.   EKG Interpretation   Date/Time:  Wednesday October 25 2015 22:46:09 EST Ventricular Rate:  69 PR Interval:  131 QRS Duration: 92 QT Interval:  383 QTC Calculation: 410 R Axis:   45 Text Interpretation:  Sinus rhythm Probable left atrial enlargement RSR'  in V1 or V2, right VCD or RVH Confirmed by DELO  MD, DOUGLAS ([id number]) on  10/25/2015 10:50:21 PM      MDM   Final diagnoses:  Acute bronchitis, unspecified organism  Acute maxillary sinusitis, recurrence not specified    Patients  labs reviewed.  Radiological studies were viewed, interpreted and considered during the medical decision making and disposition process. I agree with radiologists  reading.  Results were also discussed with patient.   Exam and h/o c/w acute sinusitis/ probably environmental allergy induced.  She also has h/o suggesting bronchitis with bronchospasm although has no wheezing or sob currently here.  She was placed on zithromax, also given albuterol mdi with spacer for cough/wheezing.  Advised allergy med such as zyrtec d or claritin d.  Plan f/u with pcp if sx not improving with tx.     Evalee Jefferson, PA-C 10/26/15 1913  Evalee Jefferson, PA-C 11/03/15 [id number]  Veryl Speak, MD 11/03/15 [phone]

## 2015-10-26 NOTE — Discharge Instructions (Signed)
Acute Bronchitis Bronchitis is inflammation of the airways that extend from the windpipe into the lungs (bronchi). The inflammation often causes mucus to develop. This leads to a cough, which is the most common symptom of bronchitis.  In acute bronchitis, the condition usually develops suddenly and goes away over time, usually in a couple weeks. Smoking, allergies, and asthma can make bronchitis worse. Repeated episodes of bronchitis may cause further lung problems.  CAUSES Acute bronchitis is most often caused by the same virus that causes a cold. The virus can spread from person to person (contagious) through coughing, sneezing, and touching contaminated objects. SIGNS AND SYMPTOMS   Cough.   Fever.   Coughing up mucus.   Body aches.   Chest congestion.   Chills.   Shortness of breath.   Sore throat.  DIAGNOSIS  Acute bronchitis is usually diagnosed through a physical exam. Your health care provider will also ask you questions about your medical history. Tests, such as chest X-rays, are sometimes done to rule out other conditions.  TREATMENT  Acute bronchitis usually goes away in a couple weeks. Oftentimes, no medical treatment is necessary. Medicines are sometimes given for relief of fever or cough. Antibiotic medicines are usually not needed but may be prescribed in certain situations. In some cases, an inhaler may be recommended to help reduce shortness of breath and control the cough. A cool mist vaporizer may also be used to help thin bronchial secretions and make it easier to clear the chest.  HOME CARE INSTRUCTIONS  Get plenty of rest.   Drink enough fluids to keep your urine clear or pale yellow (unless you have a medical condition that requires fluid restriction). Increasing fluids may help thin your respiratory secretions (sputum) and reduce chest congestion, and it will prevent dehydration.   Take medicines only as directed by your health care provider.  If  you were prescribed an antibiotic medicine, finish it all even if you start to feel better.  Avoid smoking and secondhand smoke. Exposure to cigarette smoke or irritating chemicals will make bronchitis worse. If you are a smoker, consider using nicotine gum or skin patches to help control withdrawal symptoms. Quitting smoking will help your lungs heal faster.   Reduce the chances of another bout of acute bronchitis by washing your hands frequently, avoiding people with cold symptoms, and trying not to touch your hands to your mouth, nose, or eyes.   Keep all follow-up visits as directed by your health care provider.  SEEK MEDICAL CARE IF: Your symptoms do not improve after 1 week of treatment.  SEEK IMMEDIATE MEDICAL CARE IF:  You develop an increased fever or chills.   You have chest pain.   You have severe shortness of breath.  You have bloody sputum.   You develop dehydration.  You faint or repeatedly feel like you are going to pass out.  You develop repeated vomiting.  You develop a severe headache. MAKE SURE YOU:   Understand these instructions.  Will watch your condition.  Will get help right away if you are not doing well or get worse.   This information is not intended to replace advice given to you by your health care provider. Make sure you discuss any questions you have with your health care provider.   Document Released: 12/26/2004 Document Revised: 12/09/2014 Document Reviewed: 05/11/2013 Elsevier Interactive Patient Education 2016 Elsevier [employer]  Sinusitis, Adult Sinusitis is redness, soreness, and inflammation of the paranasal sinuses. Paranasal sinuses are air pockets within  the bones of your face. They are located beneath your eyes, in the middle of your forehead, and above your eyes. In healthy paranasal sinuses, mucus is able to drain out, and air is able to circulate through them by way of your nose. However, when your paranasal sinuses are inflamed,  mucus and air can become trapped. This can allow bacteria and other germs to grow and cause infection. Sinusitis can develop quickly and last only a short time (acute) or continue over a long period (chronic). Sinusitis that lasts for more than 12 weeks is considered chronic. CAUSES Causes of sinusitis include:  Allergies.  Structural abnormalities, such as displacement of the cartilage that separates your nostrils (deviated septum), which can decrease the air flow through your nose and sinuses and affect sinus drainage.  Functional abnormalities, such as when the small hairs (cilia) that line your sinuses and help remove mucus do not work properly or are not present. SIGNS AND SYMPTOMS Symptoms of acute and chronic sinusitis are the same. The primary symptoms are pain and pressure around the affected sinuses. Other symptoms include:  Upper toothache.  Earache.  Headache.  Bad breath.  Decreased sense of smell and taste.  A cough, which worsens when you are lying flat.  Fatigue.  Fever.  Thick drainage from your nose, which often is green and may contain pus (purulent).  Swelling and warmth over the affected sinuses. DIAGNOSIS Your health care provider will perform a physical exam. During your exam, your health care provider may perform any of the following to help determine if you have acute sinusitis or chronic sinusitis:  Look in your nose for signs of abnormal growths in your nostrils (nasal polyps).  Tap over the affected sinus to check for signs of infection.  View the inside of your sinuses using an imaging device that has a light attached (endoscope). If your health care provider suspects that you have chronic sinusitis, one or more of the following tests may be recommended:  Allergy tests.  Nasal culture. A sample of mucus is taken from your nose, sent to a lab, and screened for bacteria.  Nasal cytology. A sample of mucus is taken from your nose and examined by  your health care provider to determine if your sinusitis is related to an allergy. TREATMENT Most cases of acute sinusitis are related to a viral infection and will resolve on their own within 10 days. Sometimes, medicines are prescribed to help relieve symptoms of both acute and chronic sinusitis. These may include pain medicines, decongestants, nasal steroid sprays, or saline sprays. However, for sinusitis related to a bacterial infection, your health care provider will prescribe antibiotic medicines. These are medicines that will help kill the bacteria causing the infection. Rarely, sinusitis is caused by a fungal infection. In these cases, your health care provider will prescribe antifungal medicine. For some cases of chronic sinusitis, surgery is needed. Generally, these are cases in which sinusitis recurs more than 3 times per year, despite other treatments. HOME CARE INSTRUCTIONS  Drink plenty of water. Water helps thin the mucus so your sinuses can drain more easily.  Use a humidifier.  Inhale steam 3-4 times a day (for example, sit in the bathroom with the shower running).  Apply a warm, moist washcloth to your face 3-4 times a day, or as directed by your health care provider.  Use saline nasal sprays to help moisten and clean your sinuses.  Take medicines only as directed by your health care provider.  If you were prescribed either an antibiotic or antifungal medicine, finish it all even if you start to feel better. SEEK IMMEDIATE MEDICAL CARE IF:  You have increasing pain or severe headaches.  You have nausea, vomiting, or drowsiness.  You have swelling around your face.  You have vision problems.  You have a stiff neck.  You have difficulty breathing.   This information is not intended to replace advice given to you by your health care provider. Make sure you discuss any questions you have with your health care provider.   Document Released: 11/18/2005 Document  Revised: 12/09/2014 Document Reviewed: 12/03/2011 Elsevier Interactive Patient Education 2016 [place] your next dose of the zithromax tomorrow evening.  Use 2 puffs of the inhaler medicine given if you are coughing, short of breath or feel chest tightness.  I also recommend taking an allergy and decongestant medicine daily such as claritin D or zyrtec D.  You do not need a prescription for this medicine.

## 2016-05-24 ENCOUNTER — Encounter: Payer: Self-pay | Admitting: Adult Health

## 2016-05-24 ENCOUNTER — Ambulatory Visit (INDEPENDENT_AMBULATORY_CARE_PROVIDER_SITE_OTHER): Payer: BLUE CROSS/BLUE SHIELD | Admitting: Adult Health

## 2016-05-24 ENCOUNTER — Other Ambulatory Visit: Payer: BLUE CROSS/BLUE SHIELD | Admitting: Adult Health

## 2016-05-24 VITALS — BP 140/90 | HR 64 | Ht 65.0 in | Wt 141.0 lb

## 2016-05-24 DIAGNOSIS — N926 Irregular menstruation, unspecified: Secondary | ICD-10-CM

## 2016-05-24 DIAGNOSIS — D259 Leiomyoma of uterus, unspecified: Secondary | ICD-10-CM

## 2016-05-24 DIAGNOSIS — Z01411 Encounter for gynecological examination (general) (routine) with abnormal findings: Secondary | ICD-10-CM | POA: Diagnosis not present

## 2016-05-24 DIAGNOSIS — Z1211 Encounter for screening for malignant neoplasm of colon: Secondary | ICD-10-CM | POA: Diagnosis not present

## 2016-05-24 DIAGNOSIS — N951 Menopausal and female climacteric states: Secondary | ICD-10-CM | POA: Diagnosis not present

## 2016-05-24 DIAGNOSIS — Z01419 Encounter for gynecological examination (general) (routine) without abnormal findings: Secondary | ICD-10-CM

## 2016-05-24 LAB — HEMOCCULT GUIAC POC 1CARD (OFFICE): Fecal Occult Blood, POC: NEGATIVE

## 2016-05-24 NOTE — Patient Instructions (Signed)
Physical in 1 year,pap in 2019 Mammogram yearly Colonoscopy advised now

## 2016-05-24 NOTE — Progress Notes (Signed)
Patient ID: Margarethe Schoenberger, female   DOB: 1966/09/21, 50 y.o.   MRN: [id number] History of Present Illness: Lisa-Jane is a 50 year old white female,married in for a well woman gyn exam,she had a normal pap,with negative HPV 12/15/14.   Current Medications, Allergies, Past Medical History, Past Surgical History, Family History and Social History were reviewed in [employer] record.     Review of Systems: Patient denies any headaches, hearing loss, fatigue, blurred vision, shortness of breath, chest pain, abdominal pain, problems with bowel movements, urination, or intercourse. No joint pain or mood swings.She has not had a period since February.She has some left lower back pain at times,she lifts bags of dirt and grandkids a lot.    Physical Exam:BP 140/90 mmHg  Pulse 64  Ht 5\' 5"  (1.651 m)  Wt 141 lb (63.957 kg)  BMI 23.46 kg/m2  LMP 01/22/2016 General:  Well developed, well nourished, no acute distress Skin:  Warm and dry Neck:  Midline trachea, normal thyroid, good ROM, no lymphadenopathy Lungs; Clear to auscultation bilaterally Breast:  No dominant palpable mass, retraction, or nipple discharge Cardiovascular: Regular rate and rhythm Abdomen:  Soft, non tender, no hepatosplenomegaly Pelvic:  External genitalia is normal in appearance, no lesions.  The vagina is normal in appearance. Urethra has no lesions or masses. The cervix is bulbous.  Uterus is felt to be enlarged, has known fibroids.  No adnexal masses or tenderness noted.Bladder is non tender, no masses felt. Rectal: Good sphincter tone, no polyps, or hemorrhoids felt.  Hemoccult negative. Extremities/musculoskeletal:  No swelling or varicosities noted, no clubbing or cyanosis Psych:  No mood changes, alert and cooperative,seems happy   Impression: Well woman gyn exam,no pap Peri menopause Irregular periods Fibroids     Plan: Physical in 1 year, pap 2019 Mammogram yearly Colonoscopy advised Try ice  on back, declines muscle relaxer

## 2016-10-27 DIAGNOSIS — R0602 Shortness of breath: Secondary | ICD-10-CM | POA: Diagnosis not present

## 2016-10-27 DIAGNOSIS — R05 Cough: Secondary | ICD-10-CM | POA: Diagnosis not present

## 2016-10-27 DIAGNOSIS — J0100 Acute maxillary sinusitis, unspecified: Secondary | ICD-10-CM | POA: Diagnosis not present

## 2016-10-27 DIAGNOSIS — H9202 Otalgia, left ear: Secondary | ICD-10-CM | POA: Diagnosis not present

## 2018-01-11 DIAGNOSIS — J0100 Acute maxillary sinusitis, unspecified: Secondary | ICD-10-CM | POA: Diagnosis not present

## 2018-02-02 ENCOUNTER — Telehealth: Payer: Self-pay | Admitting: Adult Health

## 2018-02-02 DIAGNOSIS — Z131 Encounter for screening for diabetes mellitus: Secondary | ICD-10-CM

## 2018-02-02 DIAGNOSIS — Z13220 Encounter for screening for lipoid disorders: Secondary | ICD-10-CM

## 2018-02-02 DIAGNOSIS — R5383 Other fatigue: Secondary | ICD-10-CM

## 2018-02-02 DIAGNOSIS — R635 Abnormal weight gain: Secondary | ICD-10-CM

## 2018-02-02 DIAGNOSIS — Z1321 Encounter for screening for nutritional disorder: Secondary | ICD-10-CM

## 2018-02-02 DIAGNOSIS — N926 Irregular menstruation, unspecified: Secondary | ICD-10-CM

## 2018-02-02 NOTE — Telephone Encounter (Signed)
Patient called stating that she would like to come in and get some fasting lab one week before her appointment with jennifer on 3/21. Please place orders in.

## 2018-02-03 NOTE — Telephone Encounter (Signed)
Pt wants labs prior to appt 3/21, is is having fatigue, irregular periods and weight gain

## 2018-02-13 DIAGNOSIS — Z13220 Encounter for screening for lipoid disorders: Secondary | ICD-10-CM | POA: Diagnosis not present

## 2018-02-13 DIAGNOSIS — R5383 Other fatigue: Secondary | ICD-10-CM | POA: Diagnosis not present

## 2018-02-13 DIAGNOSIS — N926 Irregular menstruation, unspecified: Secondary | ICD-10-CM | POA: Diagnosis not present

## 2018-02-13 DIAGNOSIS — R635 Abnormal weight gain: Secondary | ICD-10-CM | POA: Diagnosis not present

## 2018-02-14 LAB — COMPREHENSIVE METABOLIC PANEL
A/G RATIO: 2 (ref 1.2–2.2)
ALT: 19 IU/L (ref 0–32)
AST: 20 IU/L (ref 0–40)
Albumin: 4.7 g/dL (ref 3.5–5.5)
Alkaline Phosphatase: 94 IU/L (ref 39–117)
BILIRUBIN TOTAL: 0.6 mg/dL (ref 0.0–1.2)
BUN/Creatinine Ratio: 19 (ref 9–23)
BUN: 15 mg/dL (ref 6–24)
CALCIUM: 11.4 mg/dL — AB (ref 8.7–10.2)
CHLORIDE: 103 mmol/L (ref 96–106)
CO2: 26 mmol/L (ref 20–29)
Creatinine, Ser: 0.79 mg/dL (ref 0.57–1.00)
GFR, EST AFRICAN AMERICAN: 100 mL/min/{1.73_m2} (ref >59)
GFR, EST NON AFRICAN AMERICAN: 86 mL/min/{1.73_m2} (ref >59)
GLOBULIN, TOTAL: 2.3 g/dL (ref 1.5–4.5)
Glucose: 88 mg/dL (ref 65–99)
POTASSIUM: 5 mmol/L (ref 3.5–5.2)
SODIUM: 140 mmol/L (ref 134–144)
Total Protein: 7 g/dL (ref 6.0–8.5)

## 2018-02-14 LAB — HEMOGLOBIN A1C
ESTIMATED AVERAGE GLUCOSE: 105 mg/dL
HEMOGLOBIN A1C: 5.3 % (ref 4.8–5.6)

## 2018-02-14 LAB — CBC
HEMATOCRIT: 45.7 % (ref 34.0–46.6)
Hemoglobin: 15.5 g/dL (ref 11.1–15.9)
MCH: 29.5 pg (ref 26.6–33.0)
MCHC: 33.9 g/dL (ref 31.5–35.7)
MCV: 87 fL (ref 79–97)
Platelets: 242 10*3/uL (ref 150–379)
RBC: 5.26 x10E6/uL (ref 3.77–5.28)
RDW: 13 % (ref 12.3–15.4)
WBC: 4.1 10*3/uL (ref 3.4–10.8)

## 2018-02-14 LAB — LIPID PANEL
CHOL/HDL RATIO: 2.5 ratio (ref 0.0–4.4)
CHOLESTEROL TOTAL: 191 mg/dL (ref 100–199)
HDL: 75 mg/dL (ref >39)
LDL Calculated: 109 mg/dL — ABNORMAL HIGH (ref 0–99)
TRIGLYCERIDES: 37 mg/dL (ref 0–149)
VLDL Cholesterol Cal: 7 mg/dL (ref 5–40)

## 2018-02-14 LAB — TSH: TSH: 3.13 u[IU]/mL (ref 0.450–4.500)

## 2018-02-14 LAB — VITAMIN D 25 HYDROXY (VIT D DEFICIENCY, FRACTURES): VIT D 25 HYDROXY: 21 ng/mL — AB (ref 30.0–100.0)

## 2018-02-14 LAB — FOLLICLE STIMULATING HORMONE: FSH: 105.7 m[IU]/mL

## 2018-02-19 ENCOUNTER — Encounter: Payer: Self-pay | Admitting: Adult Health

## 2018-02-19 ENCOUNTER — Other Ambulatory Visit (HOSPITAL_COMMUNITY)
Admission: RE | Admit: 2018-02-19 | Discharge: 2018-02-19 | Disposition: A | Discharge status: Home / Self Care | Payer: BLUE CROSS/BLUE SHIELD | Source: Ambulatory Visit | Attending: Adult Health | Admitting: Adult Health

## 2018-02-19 ENCOUNTER — Other Ambulatory Visit: Payer: Self-pay

## 2018-02-19 ENCOUNTER — Ambulatory Visit (INDEPENDENT_AMBULATORY_CARE_PROVIDER_SITE_OTHER): Payer: BLUE CROSS/BLUE SHIELD | Admitting: Adult Health

## 2018-02-19 VITALS — BP 144/82 | HR 82 | Resp 18 | Ht 65.0 in | Wt 156.0 lb

## 2018-02-19 DIAGNOSIS — Z1211 Encounter for screening for malignant neoplasm of colon: Secondary | ICD-10-CM

## 2018-02-19 DIAGNOSIS — R232 Flushing: Secondary | ICD-10-CM | POA: Diagnosis not present

## 2018-02-19 DIAGNOSIS — Z01419 Encounter for gynecological examination (general) (routine) without abnormal findings: Secondary | ICD-10-CM | POA: Diagnosis not present

## 2018-02-19 DIAGNOSIS — Z01411 Encounter for gynecological examination (general) (routine) with abnormal findings: Secondary | ICD-10-CM | POA: Diagnosis not present

## 2018-02-19 DIAGNOSIS — Z1212 Encounter for screening for malignant neoplasm of rectum: Secondary | ICD-10-CM | POA: Diagnosis not present

## 2018-02-19 DIAGNOSIS — E559 Vitamin D deficiency, unspecified: Secondary | ICD-10-CM | POA: Insufficient documentation

## 2018-02-19 DIAGNOSIS — N950 Postmenopausal bleeding: Secondary | ICD-10-CM | POA: Insufficient documentation

## 2018-02-19 DIAGNOSIS — Z780 Asymptomatic menopausal state: Secondary | ICD-10-CM | POA: Diagnosis not present

## 2018-02-19 LAB — HEMOCCULT GUIAC POC 1CARD (OFFICE): FECAL OCCULT BLD: NEGATIVE

## 2018-02-19 MED ORDER — CHOLECALCIFEROL 125 MCG (5000 UT) PO CAPS: 5000.0000 [IU] | ORAL_CAPSULE | Freq: Every day | ORAL | Status: DC

## 2018-02-19 NOTE — Progress Notes (Signed)
Patient ID: Nancy Barrett, female   DOB: February 22, 1966, 52 y.o.   MRN: [id number] History of Present Illness: Nancy Barrett is a 52 year old white female, married in for a well woman gyn exam and pap.She had labs earlier and will review those today.    Current Medications, Allergies, Past Medical History, Past Surgical History, Family History and Social History were reviewed in [employer] record.     Review of Systems: Patient denies any headaches, hearing loss, fatigue, blurred vision, shortness of breath, chest pain, abdominal pain, problems with bowel movements, urination, or intercourse. No joint pain or mood swings. Wipes light red at times, last bleeding in December and it had been right at a year since last period.+weight gain, and she exercises daily.She says since she stopped sodas feels better. Has difficulty swallowing at times, she has noted.  +hot flashes.   Physical Exam:BP (!) 144/82 (BP Location: Right Arm, Patient Position: Sitting, Cuff Size: Normal)   Pulse 82   Resp 18   Ht 5\' 5"  (1.651 m)   Wt 156 lb (70.8 kg)   LMP 11/22/2017 (Approximate)   BMI 25.96 kg/m  General:  Well developed, well nourished, no acute distress Skin:  Warm and dry Neck:  Midline trachea, normal thyroid, good ROM, no lymphadenopathy Lungs; Clear to auscultation bilaterally Breast:  No dominant palpable mass, retraction, or nipple discharge Cardiovascular: Regular rate and rhythm Abdomen:  Soft, non tender, no hepatosplenomegaly Pelvic:  External genitalia is normal in appearance, no lesions.  The vagina is normal in appearance. Urethra has no lesions or masses. The cervix is bulbous.Pap with HPV performed.  Uterus is felt to be normal size, shape, and contour.  No adnexal masses or tenderness noted.Bladder is non tender, no masses felt. Rectal: Good sphincter tone, no polyps, or hemorrhoids felt.  Hemoccult negative. Extremities/musculoskeletal:  No swelling or varicosities noted, no  clubbing or cyanosis Psych:  No mood changes, alert and cooperative,seems happy PHQ 2 score 0. FSH 105.7, Calcium 11.4,reviewd labs with her and gave her a copy, will get GYN [place] to assess ?PMB and will refer to Dr Dorris Fetch for elevated calcium level.Vitamin D level 21 take 5000 IU vitamin D3 daily.  Impression: 1. Encounter for gynecological examination with Papanicolaou smear of cervix   2. Screening for colorectal cancer   3. Serum calcium elevated   4. Postmenopausal   5. PMB (postmenopausal bleeding)   6. Vitamin D deficiency       Plan: Meds ordered this encounter  Medications  . Cholecalciferol 5000 units capsule    Sig: Take 1 capsule (5,000 Units total) by mouth daily.    Order Specific Question:   Supervising Provider    Answer:   Florian Buff [[id number]]   Return in 2 weeks for GYN US,will talk when results back  Referred to Dr Dorris Fetch for evaluation of elevated calcium level Get mammogram now and yearly Physical in 1 year Pap in 3 if normal

## 2018-02-20 ENCOUNTER — Other Ambulatory Visit: Payer: Self-pay | Admitting: Adult Health

## 2018-02-20 DIAGNOSIS — Z1231 Encounter for screening mammogram for malignant neoplasm of breast: Secondary | ICD-10-CM

## 2018-02-24 LAB — CYTOLOGY - PAP
Diagnosis: NEGATIVE
HPV (WINDOPATH): DETECTED — AB

## 2018-03-02 ENCOUNTER — Other Ambulatory Visit: Payer: Self-pay | Admitting: Adult Health

## 2018-03-02 ENCOUNTER — Encounter: Payer: Self-pay | Admitting: Adult Health

## 2018-03-02 ENCOUNTER — Ambulatory Visit (HOSPITAL_COMMUNITY)
Admission: RE | Admit: 2018-03-02 | Discharge: 2018-03-02 | Disposition: A | Discharge status: Home / Self Care | Payer: BLUE CROSS/BLUE SHIELD | Source: Ambulatory Visit | Attending: Adult Health | Admitting: Adult Health

## 2018-03-02 ENCOUNTER — Telehealth: Payer: Self-pay | Admitting: Adult Health

## 2018-03-02 ENCOUNTER — Encounter (HOSPITAL_COMMUNITY): Payer: Self-pay

## 2018-03-02 DIAGNOSIS — Z1231 Encounter for screening mammogram for malignant neoplasm of breast: Secondary | ICD-10-CM | POA: Diagnosis not present

## 2018-03-02 DIAGNOSIS — R87810 Cervical high risk human papillomavirus (HPV) DNA test positive: Secondary | ICD-10-CM

## 2018-03-02 DIAGNOSIS — R928 Other abnormal and inconclusive findings on diagnostic imaging of breast: Secondary | ICD-10-CM | POA: Insufficient documentation

## 2018-03-02 HISTORY — DX: Cervical high risk human papillomavirus (HPV) DNA test positive: R87.810

## 2018-03-02 NOTE — Telephone Encounter (Signed)
Left message to call me.

## 2018-03-02 NOTE — Telephone Encounter (Signed)
Pt aware +HPV on pap repeat in 1 year and that mammogram abnormal will need F/U and that xray will call her and set up

## 2018-03-03 ENCOUNTER — Encounter: Payer: Self-pay | Admitting: Adult Health

## 2018-03-03 ENCOUNTER — Ambulatory Visit (INDEPENDENT_AMBULATORY_CARE_PROVIDER_SITE_OTHER): Payer: BLUE CROSS/BLUE SHIELD

## 2018-03-03 ENCOUNTER — Telehealth: Payer: Self-pay | Admitting: Adult Health

## 2018-03-03 DIAGNOSIS — N950 Postmenopausal bleeding: Secondary | ICD-10-CM

## 2018-03-03 DIAGNOSIS — N83291 Other ovarian cyst, right side: Secondary | ICD-10-CM | POA: Diagnosis not present

## 2018-03-03 DIAGNOSIS — D252 Subserosal leiomyoma of uterus: Secondary | ICD-10-CM

## 2018-03-03 DIAGNOSIS — R9389 Abnormal findings on diagnostic imaging of other specified body structures: Secondary | ICD-10-CM

## 2018-03-03 HISTORY — DX: Abnormal findings on diagnostic imaging of other specified body structures: R93.89

## 2018-03-03 NOTE — Progress Notes (Signed)
PELVIC US TA/TV:enlarged heterogeneous anteverted uterus w/mult fibroids,(#1) posterior subserosal fibroid 3.9 x 4 x 2.9 cm,(#2) anterior left subserosal fibroid 3.3 x 2.4 x 1.8 cm,(#3) fundal subserosal fibroid 2.1 x 1.6 x 1.8 cm,thickened EEC 11 mm w/a endometrial polyp 1.5 x .9 x 1.2 cm (w/color flow),normal left ovary,two complex right ovarian cysts 1.8 x 1.2 x 1.4 cm,(#2) 1.7 x 1.5 x 1.4 cm,ovaries appear mobile,no pain during ultrasound,no free fluid

## 2018-03-03 NOTE — Telephone Encounter (Signed)
Pt aware US showed thickened endometrium with ?polyp, will get endometrial biopsy, has known fibroids and also has cyst left ovary.

## 2018-03-10 ENCOUNTER — Ambulatory Visit (HOSPITAL_COMMUNITY)
Admission: RE | Admit: 2018-03-10 | Discharge: 2018-03-10 | Disposition: A | Discharge status: Home / Self Care | Payer: BLUE CROSS/BLUE SHIELD | Source: Ambulatory Visit | Attending: Adult Health | Admitting: Adult Health

## 2018-03-10 ENCOUNTER — Encounter (HOSPITAL_COMMUNITY): Payer: Self-pay

## 2018-03-10 DIAGNOSIS — R928 Other abnormal and inconclusive findings on diagnostic imaging of breast: Secondary | ICD-10-CM | POA: Diagnosis not present

## 2018-03-10 DIAGNOSIS — R921 Mammographic calcification found on diagnostic imaging of breast: Secondary | ICD-10-CM | POA: Insufficient documentation

## 2018-03-10 DIAGNOSIS — R922 Inconclusive mammogram: Secondary | ICD-10-CM | POA: Diagnosis not present

## 2018-03-10 DIAGNOSIS — N6002 Solitary cyst of left breast: Secondary | ICD-10-CM | POA: Diagnosis not present

## 2018-03-19 ENCOUNTER — Other Ambulatory Visit: Payer: Self-pay

## 2018-03-19 ENCOUNTER — Ambulatory Visit (INDEPENDENT_AMBULATORY_CARE_PROVIDER_SITE_OTHER): Payer: BLUE CROSS/BLUE SHIELD | Admitting: Obstetrics & Gynecology

## 2018-03-19 ENCOUNTER — Other Ambulatory Visit: Payer: Self-pay | Admitting: Obstetrics & Gynecology

## 2018-03-19 ENCOUNTER — Encounter: Payer: Self-pay | Admitting: Obstetrics & Gynecology

## 2018-03-19 VITALS — BP 122/84 | HR 78 | Ht 66.0 in | Wt 151.0 lb

## 2018-03-19 DIAGNOSIS — N840 Polyp of corpus uteri: Secondary | ICD-10-CM | POA: Diagnosis not present

## 2018-03-19 DIAGNOSIS — Z3202 Encounter for pregnancy test, result negative: Secondary | ICD-10-CM | POA: Diagnosis not present

## 2018-03-19 DIAGNOSIS — N950 Postmenopausal bleeding: Secondary | ICD-10-CM | POA: Diagnosis not present

## 2018-03-19 DIAGNOSIS — R9389 Abnormal findings on diagnostic imaging of other specified body structures: Secondary | ICD-10-CM | POA: Diagnosis not present

## 2018-03-19 LAB — POCT URINE PREGNANCY: Preg Test, Ur: NEGATIVE

## 2018-03-19 NOTE — Progress Notes (Signed)
Endometrial Biopsy Procedure Note  Pre-operative Diagnosis: Postmenopausal bleeding with thickened endometrium  Post-operative Diagnosis: Same  Indications: Postmenopausal bleeding with thickened endometrium  Procedure Details   Urine pregnancy test was not done.  The risks (including infection, bleeding, pain, and uterine perforation) and benefits of the procedure were explained to the patient and Written informed consent was obtained.  Antibiotic prophylaxis against endocarditis was not indicated.   The patient was placed in the dorsal lithotomy position.  Bimanual exam showed the uterus to be in the neutral position.  A Graves' speculum inserted in the vagina, and the cervix prepped with povidone iodine.  Endocervical curettage with a Kevorkian curette was not performed.   A sharp tenaculum was applied to the anterior lip of the cervix for stabilization.  A sterile uterine sound was used to sound the uterus to a depth of 6.5cm.  A Pipelle endometrial aspirator was used to sample the endometrium.  Sample was sent for pathologic examination.  Condition: Stable  Complications: None  Plan:  The patient was advised to call for any fever or for prolonged or severe pain or bleeding. She was advised to use OTC analgesics as needed for mild to moderate pain. She was advised to avoid vaginal intercourse for 48 hours or until the bleeding has completely stopped.  Attending Physician Documentation: I was present for or performed the following: Endometrial ablation

## 2018-03-24 ENCOUNTER — Ambulatory Visit (INDEPENDENT_AMBULATORY_CARE_PROVIDER_SITE_OTHER): Payer: BLUE CROSS/BLUE SHIELD | Admitting: "Endocrinology

## 2018-03-24 ENCOUNTER — Encounter: Payer: Self-pay | Admitting: "Endocrinology

## 2018-03-24 ENCOUNTER — Other Ambulatory Visit: Payer: Self-pay

## 2018-03-24 NOTE — Progress Notes (Signed)
Endocrinology Consult Note       03/24/2018, 9:04 AM  Nancy Barrett is a 52 y.o.-year-old female, referred by her PCP, Derrek Monaco , NP, for evaluation for hypercalcemia.  Past Medical History:  Diagnosis Date  . Anemia   . Bronchitis   . Cervical high risk human papillomavirus (HPV) DNA test positive 03/02/2018  . Fibroids 12/26/2014  . Hot flashes 12/15/2014  . Irregular periods 12/15/2014  . Melanoma ([place])    nose, removed  . Ovarian cyst 12/26/2014  . Peri-menopause 12/15/2014  . Thickened endometrium 03/03/2018   ?polyp, will get endo bx    Past Surgical History:  Procedure Laterality Date  . SKIN CANCER EXCISION  2014   removed from nose  . TUBAL LIGATION      Social History   Tobacco Use  . Smoking status: Never Smoker  . Smokeless tobacco: Never Used  Substance Use Topics  . Alcohol use: No  . Drug use: No    Outpatient Encounter Medications as of 03/24/2018  Medication Sig  . Cholecalciferol 5000 units capsule Take 1 capsule (5,000 Units total) by mouth daily.  Marland Kitchen OVER THE COUNTER MEDICATION Release dietary supplement   No facility-administered encounter medications on file as of 03/24/2018.     Allergies  Allergen Reactions  . Amoxicillin Hives  . Penicillins Hives     HPI  Nancy Barrett was diagnosed with hypercalcemia at least from 2016.  She did not require any specific intervention.   I reviewed patient's pertinent labs:  Lab Results  Component Value Date   CALCIUM 11.4 (H) 02/13/2018   CALCIUM 10.6 (H) 10/25/2015   CALCIUM 10.7 (H) 12/15/2014  -She never had associated PTH measurement. She has had nephrolithiasis 2 years ago which she passed without intervention.  No prior history of fragility fractures or falls.   No history of CKD. Last BUN/Cr: Lab Results  Component Value Date   BUN 15 02/13/2018   CREATININE 0.79 02/13/2018    she is not on HCTZ or other thiazide therapy.  She  is currently on vitamin D3 supplement, no vitamin D measurements in her records.   she is not on calcium supplements,  she eats dairy and green, leafy, vegetables on average amounts.  she does not have a family history of hypercalcemia, pituitary tumors, thyroid cancer, or osteoporosis.  -She never had a screening bone density.  She is 1 year postmenopausal.   ROS:  Constitutional: no weight gain/loss, no fatigue, no subjective hyperthermia, no subjective hypothermia Eyes: no blurry vision, no xerophthalmia ENT: no sore throat, no nodules palpated in throat, no dysphagia/odynophagia, no hoarseness Cardiovascular: no Chest Pain, no Shortness of Breath, no palpitations, no leg swelling Respiratory: no cough, no SOB Gastrointestinal: no Nausea/Vomiting/Diarhhea Musculoskeletal: no muscle/joint aches Skin: no rashes Neurological: no tremors, no numbness, no tingling, no dizziness Psychiatric: no depression, no anxiety  PE: BP 138/80   Pulse (!) 52   Ht 5\' 5"  (1.651 m)   Wt 152 lb (68.9 kg)   BMI 25.29 kg/m  Wt Readings from Last 3 Encounters:  03/24/18 152 lb (68.9 kg)  03/19/18 151 lb (68.5 kg)  02/19/18 156 lb (  70.8 kg)   Constitutional: + Appropriate weight for height, not in acute distress, normal state of mind Eyes: PERRLA, EOMI, no exophthalmos ENT: moist mucous membranes, no thyromegaly, no cervical lymphadenopathy Cardiovascular: normal precordial activity, Regular Rate and Rhythm, no Murmur/Rubs/Gallops Respiratory:  adequate breathing efforts, no gross chest deformity, Clear to auscultation bilaterally Gastrointestinal: abdomen soft, Non -tender, No distension, Bowel Sounds present Musculoskeletal: no gross deformities, strength intact in all four extremities Skin: moist, warm, no rashes Neurological: no tremor with outstretched hands, Deep tendon reflexes normal in all four extremities.   CMP ( most recent) CMP     Component Value Date/Time   NA 140 02/13/2018  0857   K 5.0 02/13/2018 0857   CL 103 02/13/2018 0857   CO2 26 02/13/2018 0857   GLUCOSE 88 02/13/2018 0857   GLUCOSE 97 10/25/2015 2250   BUN 15 02/13/2018 0857   CREATININE 0.79 02/13/2018 0857   CREATININE 0.62 12/15/2014 1635   CALCIUM 11.4 (H) 02/13/2018 0857   PROT 7.0 02/13/2018 0857   ALBUMIN 4.7 02/13/2018 0857   AST 20 02/13/2018 0857   ALT 19 02/13/2018 0857   ALKPHOS 94 02/13/2018 0857   BILITOT 0.6 02/13/2018 0857   GFRNONAA 86 02/13/2018 0857   GFRAA 100 02/13/2018 0857     Diabetic Labs (most recent): Lab Results  Component Value Date   HGBA1C 5.3 02/13/2018     Lipid Panel ( most recent) Lipid Panel     Component Value Date/Time   CHOL 191 02/13/2018 0857   TRIG 37 02/13/2018 0857   HDL 75 02/13/2018 0857   CHOLHDL 2.5 02/13/2018 0857   CHOLHDL 3.2 12/15/2014 1635   VLDL 11 12/15/2014 1635   LDLCALC 109 (H) 02/13/2018 0857      Lab Results  Component Value Date   TSH 3.130 02/13/2018   TSH 2.453 12/15/2014      Assessment: 1. Hypercalcemia  Plan: Patient has had several instances of elevated calcium, with the highest level being at 11.4 mg/dL. A corresponding  PTH level was never measured.   -Her vitamin D status is not known, she is on vitamin D3 supplement. -She has dealt with hypercalcemia for at least 3 years with 1 apparent complication of nephrolithiasis.  She denies any history of fragility fractures abdominal pain, major mood disorders, bone pain.    - I discussed with the patient about the physiology of calcium and parathyroid hormone, and possible  effects of  increased PTH/ Calcium , including kidney stones, cardiac dysrhythmias, osteoporosis, abdominal pain, etc.   - The work up so far is not sufficient to reach a conclusion for definitive therapy.  she  needs more studies to confirm and classify the parathyroid dysfunction she may have. I will proceed to obtain   intact PTH/calcium, serum magnesium, serum phosphorus.  It is also  essential to obtain 24-hour urine calcium/creatinine to rule out the rare but important cause of mild elevation in calcium and PTH- FHH ( Familial Hypocalciuric Hypercalcemia), which may not require any active intervention.  - I will request for her  DEXA scan to include the distal  33% of  radius for evaluation of cortical bone, which is predominantly affected by hyperparathyroidism.    - Time spent with the patient: 45 minutes, of which >50% was spent in obtaining information about her symptoms, reviewing her previous labs, evaluations, and treatments, counseling her about her hypercalcemia with history of nephrolithiasis, and developing a plan to confirm the diagnosis and long term treatment  as necessary.  Cristino Martes participated in the discussions, expressed understanding, and voiced agreement with the above plans.  All questions were answered to her satisfaction. she is encouraged to contact clinic should she have any questions or concerns prior to her return visit.  - Return in about 2 weeks (around 04/07/2018) for follow up with pre-visit labs, follow up with Bone Density, 24 Hours Urine Calcium.   Glade Lloyd, MD [facility] [address] [place], [place] [address] Phone: [phone]  Fax: [phone]    This note was partially dictated with voice recognition software. Similar sounding words can be transcribed inadequately or may not  be corrected upon review.  03/24/2018, 9:04 AM

## 2018-03-27 ENCOUNTER — Encounter: Payer: Self-pay | Admitting: Obstetrics & Gynecology

## 2018-03-27 ENCOUNTER — Ambulatory Visit (HOSPITAL_COMMUNITY)
Admission: RE | Admit: 2018-03-27 | Discharge: 2018-03-27 | Disposition: A | Discharge status: Home / Self Care | Payer: BLUE CROSS/BLUE SHIELD | Source: Ambulatory Visit | Attending: "Endocrinology | Admitting: "Endocrinology

## 2018-03-27 ENCOUNTER — Ambulatory Visit (INDEPENDENT_AMBULATORY_CARE_PROVIDER_SITE_OTHER): Payer: BLUE CROSS/BLUE SHIELD | Admitting: Obstetrics & Gynecology

## 2018-03-27 VITALS — BP 120/80 | HR 98 | Ht 65.0 in | Wt 150.4 lb

## 2018-03-27 DIAGNOSIS — N840 Polyp of corpus uteri: Secondary | ICD-10-CM

## 2018-03-27 DIAGNOSIS — M8580 Other specified disorders of bone density and structure, unspecified site: Secondary | ICD-10-CM | POA: Diagnosis not present

## 2018-03-27 DIAGNOSIS — M8589 Other specified disorders of bone density and structure, multiple sites: Secondary | ICD-10-CM | POA: Diagnosis not present

## 2018-03-27 DIAGNOSIS — Z13820 Encounter for screening for osteoporosis: Secondary | ICD-10-CM | POA: Diagnosis not present

## 2018-03-27 DIAGNOSIS — N924 Excessive bleeding in the premenopausal period: Secondary | ICD-10-CM

## 2018-03-27 LAB — TSH: TSH: 2.56 m[IU]/L

## 2018-03-27 LAB — MAGNESIUM: Magnesium: 2.1 mg/dL (ref 1.5–2.5)

## 2018-03-27 LAB — T4, FREE: FREE T4: 1 ng/dL (ref 0.8–1.8)

## 2018-03-27 LAB — PHOSPHORUS: Phosphorus: 2.2 mg/dL — ABNORMAL LOW (ref 2.5–4.5)

## 2018-03-27 MED ORDER — MEDROXYPROGESTERONE ACETATE 10 MG PO TABS: 10.0000 mg | ORAL_TABLET | Freq: Every day | ORAL | 0 refills | Status: DC

## 2018-03-27 NOTE — Progress Notes (Addendum)
Follow up appointment for results  Chief Complaint  Patient presents with  . Follow-up    result biopsy    Blood pressure 120/80, pulse 98, height 5\' 5"  (1.651 m), weight 150 lb 6.4 oz (68.2 kg), last menstrual period 11/24/2017.    Endometrial biopsy consistent wwith some ongoing estrogenic stimulation of the endometrium, no atypia or hyperplasia  MEDS ordered this encounter: Meds ordered this encounter  Medications  . medroxyPROGESTERone (PROVERA) 10 MG tablet    Sig: Take 1 tablet (10 mg total) by mouth daily.    Dispense:  30 tablet    Refill:  0    Orders for this encounter: No orders of the defined types were placed in this encounter.   Impression: Abnormal perimenopausal bleeding  Endometrial polyp    Plan: Cyclical provera for endometrial suppression  Follow Up: Return in about 3 months (around 06/26/2018) for Follow up, with Dr Elonda Husky.       Face to face time:  10 minutes  Greater than 50% of the visit time was spent in counseling and coordination of care with the patient.  The summary and outline of the counseling and care coordination is summarized in the note above.   All questions were answered.  Past Medical History:  Diagnosis Date  . Anemia   . Bronchitis   . Cervical high risk human papillomavirus (HPV) DNA test positive 03/02/2018  . Fibroids 12/26/2014  . Hot flashes 12/15/2014  . Irregular periods 12/15/2014  . Melanoma ([place])    nose, removed  . Ovarian cyst 12/26/2014  . Peri-menopause 12/15/2014  . Thickened endometrium 03/03/2018   ?polyp, will get endo bx    Past Surgical History:  Procedure Laterality Date  . SKIN CANCER EXCISION  2014   removed from nose  . TUBAL LIGATION      OB History    Gravida  3   Para  3   Term      Preterm      AB      Living  3     SAB      TAB      Ectopic      Multiple      Live Births              Allergies  Allergen Reactions  . Amoxicillin Hives  . Penicillins Hives     Social History   Socioeconomic History  . Marital status: Married    Spouse name: Not on file  . Number of children: Not on file  . Years of education: Not on file  . Highest education level: Not on file  Occupational History  . Not on file  Social Needs  . Financial resource strain: Not on file  . Food insecurity:    Worry: Not on file    Inability: Not on file  . Transportation needs:    Medical: Not on file    Non-medical: Not on file  Tobacco Use  . Smoking status: Never Smoker  . Smokeless tobacco: Never Used  Substance and Sexual Activity  . Alcohol use: No  . Drug use: No  . Sexual activity: Yes    Birth control/protection: None, Surgical    Comment: tubal  Lifestyle  . Physical activity:    Days per week: Not on file    Minutes per session: Not on file  . Stress: Not on file  Relationships  . Social connections:    Talks on phone: Not on file  Gets together: Not on file    Attends religious service: Not on file    Active member of club or organization: Not on file    Attends meetings of clubs or organizations: Not on file    Relationship status: Not on file  Other Topics Concern  . Not on file  Social History Narrative  . Not on file    Family History  Problem Relation Age of Onset  . Bronchitis Mother   . Arthritis Mother   . Hypertension Mother   . Hyperlipidemia Mother   . Heart attack Father   . Heart attack Paternal Uncle   . Other Maternal Grandfather        stomach problems  . Cancer Maternal Grandfather        colon cancer  . Heart attack Paternal Grandmother   . Stroke Paternal Grandfather   . Hypertension Brother   . Hypertension Brother   . Hypertension Brother   . Cancer Maternal Uncle        colon cancer  . Colon cancer Maternal Uncle

## 2018-03-28 LAB — VITAMIN D 25 HYDROXY (VIT D DEFICIENCY, FRACTURES): Vit D, 25-Hydroxy: 26 ng/mL — ABNORMAL LOW (ref 30–100)

## 2018-03-28 LAB — CALCIUM, URINE, 24 HOUR: Calcium, 24H Urine: 179 mg/24 h

## 2018-03-30 LAB — PTH, INTACT AND CALCIUM
Calcium: 11 mg/dL — ABNORMAL HIGH (ref 8.6–10.4)
PTH: 121 pg/mL — ABNORMAL HIGH (ref 14–64)

## 2018-04-07 ENCOUNTER — Ambulatory Visit (INDEPENDENT_AMBULATORY_CARE_PROVIDER_SITE_OTHER): Payer: BLUE CROSS/BLUE SHIELD | Admitting: "Endocrinology

## 2018-04-07 ENCOUNTER — Encounter: Payer: Self-pay | Admitting: "Endocrinology

## 2018-04-07 VITALS — BP 132/86 | HR 62 | Ht 65.0 in | Wt 147.0 lb

## 2018-04-07 DIAGNOSIS — E210 Primary hyperparathyroidism: Secondary | ICD-10-CM | POA: Diagnosis not present

## 2018-04-07 DIAGNOSIS — M8580 Other specified disorders of bone density and structure, unspecified site: Secondary | ICD-10-CM | POA: Diagnosis not present

## 2018-04-07 NOTE — Progress Notes (Signed)
Endocrinology follow-up Note       04/07/2018, 11:58 AM  Nancy Barrett is a 52 y.o.-year-old female, referred by her PCP, Derrek Monaco , NP, for evaluation for hypercalcemia.  Past Medical History:  Diagnosis Date  . Anemia   . Bronchitis   . Cervical high risk human papillomavirus (HPV) DNA test positive 03/02/2018  . Fibroids 12/26/2014  . Hot flashes 12/15/2014  . Irregular periods 12/15/2014  . Melanoma ([place])    nose, removed  . Ovarian cyst 12/26/2014  . Peri-menopause 12/15/2014  . Thickened endometrium 03/03/2018   ?polyp, will get endo bx    Past Surgical History:  Procedure Laterality Date  . SKIN CANCER EXCISION  2014   removed from nose  . TUBAL LIGATION      Social History   Tobacco Use  . Smoking status: Never Smoker  . Smokeless tobacco: Never Used  Substance Use Topics  . Alcohol use: No  . Drug use: No    Outpatient Encounter Medications as of 04/07/2018  Medication Sig  . Cholecalciferol 5000 units capsule Take 1 capsule (5,000 Units total) by mouth daily.  . medroxyPROGESTERone (PROVERA) 10 MG tablet Take 1 tablet (10 mg total) by mouth daily.  Marland Kitchen OVER THE COUNTER MEDICATION Release dietary supplement   No facility-administered encounter medications on file as of 04/07/2018.     Allergies  Allergen Reactions  . Amoxicillin Hives  . Penicillins Hives     HPI  Nancy Barrett was diagnosed with hypercalcemia at least from 2016.   -She was sent for  more complete labs during her last visit.  Her repeat labs revealed the following Lab Results  Component Value Date   PTH 121 (H) 03/27/2018   CALCIUM 11.0 (H) 03/27/2018   CALCIUM 11.4 (H) 02/13/2018   CALCIUM 10.6 (H) 10/25/2015   CALCIUM 10.7 (H) 12/15/2014   She has had nephrolithiasis 2 years ago which she passed without intervention.  No prior history of fragility fractures or falls.  Her recent bone density showed osteopenia.  No history  of CKD. Last BUN/Cr: Lab Results  Component Value Date   BUN 15 02/13/2018   CREATININE 0.79 02/13/2018    she is not on HCTZ or other thiazide therapy.  She is currently on vitamin D3 supplement.   she is not on calcium supplements,  she eats dairy and green, leafy, vegetables on average amounts.  she does not have a family history of hypercalcemia, pituitary tumors, thyroid cancer, or osteoporosis.  -She never had a screening bone density.  She is 1 year postmenopausal.   ROS:  Constitutional: no weight gain/loss, no fatigue, no subjective hyperthermia, no subjective hypothermia Eyes: no blurry vision, no xerophthalmia ENT: no sore throat, no nodules palpated in throat, no dysphagia/odynophagia, no hoarseness Cardiovascular: no Chest Pain, no Shortness of Breath, no palpitations, no leg swelling Respiratory: no cough, no SOB Gastrointestinal: no Nausea/Vomiting/Diarhhea Musculoskeletal: no muscle/joint aches Skin: no rashes Neurological: no tremors, no numbness, no tingling, no dizziness Psychiatric: no depression, no anxiety  PE: BP 132/86   Pulse 62   Ht 5\' 5"  (1.651 m)   Wt 147 lb (66.7 kg)  BMI 24.46 kg/m  Wt Readings from Last 3 Encounters:  04/07/18 147 lb (66.7 kg)  03/27/18 150 lb 6.4 oz (68.2 kg)  03/24/18 152 lb (68.9 kg)   Constitutional: + Appropriate weight for height, not in acute distress, normal state of mind Eyes: PERRLA, EOMI, no exophthalmos ENT: moist mucous membranes, no thyromegaly, no cervical lymphadenopathy Musculoskeletal: no gross deformities, strength intact in all four extremities Skin: moist, warm, no rashes Neurological: no tremor with outstretched hands, Deep tendon reflexes normal in all four extremities.  Recent Results (from the past 2160 hour(s))  CBC     Status: None   Collection Time: 02/13/18  8:57 AM  Result Value Ref Range   WBC 4.1 3.4 - 10.8 x10E3/uL   RBC 5.26 3.77 - 5.28 x10E6/uL   Hemoglobin 15.5 11.1 - 15.9 g/dL    Hematocrit 45.7 34.0 - 46.6 %   MCV 87 79 - 97 fL   MCH 29.5 26.6 - 33.0 pg   MCHC 33.9 31.5 - 35.7 g/dL   RDW 13.0 12.3 - 15.4 %   Platelets 242 150 - 379 x10E3/uL  Comprehensive metabolic panel     Status: Abnormal   Collection Time: 02/13/18  8:57 AM  Result Value Ref Range   Glucose 88 65 - 99 mg/dL   BUN 15 6 - 24 mg/dL   Creatinine, Ser 0.79 0.57 - 1.00 mg/dL   GFR calc non Af Amer 86 >59 mL/min/1.73   GFR calc Af Amer 100 >59 mL/min/1.73   BUN/Creatinine Ratio 19 9 - 23   Sodium 140 134 - 144 mmol/L   Potassium 5.0 3.5 - 5.2 mmol/L   Chloride 103 96 - 106 mmol/L   CO2 26 20 - 29 mmol/L   Calcium 11.4 (H) 8.7 - 10.2 mg/dL   Total Protein 7.0 6.0 - 8.5 g/dL   Albumin 4.7 3.5 - 5.5 g/dL   Globulin, Total 2.3 1.5 - 4.5 g/dL   Albumin/Globulin Ratio 2.0 1.2 - 2.2   Bilirubin Total 0.6 0.0 - 1.2 mg/dL   Alkaline Phosphatase 94 39 - 117 IU/L   AST 20 0 - 40 IU/L   ALT 19 0 - 32 IU/L  TSH     Status: None   Collection Time: 02/13/18  8:57 AM  Result Value Ref Range   TSH 3.130 0.450 - 4.500 uIU/mL  Lipid panel     Status: Abnormal   Collection Time: 02/13/18  8:57 AM  Result Value Ref Range   Cholesterol, Total 191 100 - 199 mg/dL   Triglycerides 37 0 - 149 mg/dL   HDL 75 >39 mg/dL   VLDL Cholesterol Cal 7 5 - 40 mg/dL   LDL Calculated 109 (H) 0 - 99 mg/dL   Chol/HDL Ratio 2.5 0.0 - 4.4 ratio    Comment:                                   T. Chol/HDL Ratio                                             Men  Women                               1/2 Avg.Risk  3.4  3.3                                   Avg.Risk  5.0    4.4                                2X Avg.Risk  9.6    7.1                                3X Avg.Risk 95.6   38.7   Follicle stimulating hormone     Status: None   Collection Time: 02/13/18  8:57 AM  Result Value Ref Range   FSH 105.7 mIU/mL    Comment:                     Adult Female:                       Follicular phase      3.5 -  12.5                        Ovulation phase       4.7 -  21.5                       Luteal phase          1.7 -   7.7                       Postmenopausal       25.8 - 134.8   Hemoglobin A1c     Status: None   Collection Time: 02/13/18  8:57 AM  Result Value Ref Range   Hgb A1c MFr Bld 5.3 4.8 - 5.6 %    Comment:          Prediabetes: 5.7 - 6.4          Diabetes: >6.4          Glycemic control for adults with diabetes: <7.0    Est. average glucose Bld gHb Est-mCnc 105 mg/dL  VITAMIN D 25 Hydroxy (Vit-D Deficiency, Fractures)     Status: Abnormal   Collection Time: 02/13/18  8:57 AM  Result Value Ref Range   Vit D, 25-Hydroxy 21.0 (L) 30.0 - 100.0 ng/mL    Comment: Vitamin D deficiency has been defined by the [place] and an Endocrine Society practice guideline as a level of serum 25-OH vitamin D less than 20 ng/mL (1,2). The Endocrine Society went on to further define vitamin D insufficiency as a level between 21 and 29 ng/mL (2). 1. IOM (Institute of Medicine). 2010. Dietary reference    intakes for calcium and D. [place]: The    [employer]. 2. Holick MF, Binkley Campbell, Bischoff-Ferrari HA, et al.    Evaluation, treatment, and prevention of vitamin D    deficiency: an Endocrine Society clinical practice    guideline. JCEM. 2011 Jul; 96(7):1911-30.   Cytology - PAP     Status: Abnormal   Collection Time: 02/19/18 12:00 AM  Result Value Ref Range   Adequacy      Satisfactory for evaluation. The presence or absence of an endocervical / transformation zone component cannot be  determined because of atrophy.   Diagnosis      NEGATIVE FOR INTRAEPITHELIAL LESIONS OR MALIGNANCY.   HPV DETECTED (A)     Comment: Normal Reference Range - NOT Detected   Material Submitted CervicoVaginal Pap [ThinPrep Imaged]    CYTOLOGY - PAP PAP RESULT   POCT occult blood stool     Status: None   Collection Time: 02/19/18  4:19 PM  Result Value Ref Range   Fecal Occult Blood, POC Negative  Negative   Card #1 Date     Card #2 Fecal Occult Blod, POC     Card #2 Date     Card #3 Fecal Occult Blood, POC     Card #3 Date    POCT urine pregnancy     Status: None   Collection Time: 03/19/18  1:45 PM  Result Value Ref Range   Preg Test, Ur Negative Negative  PTH, intact and calcium     Status: Abnormal   Collection Time: 03/27/18  9:43 AM  Result Value Ref Range   PTH 121 (H) 14 - 64 pg/mL    Comment: . Interpretive Guide    Intact PTH           Calcium ------------------    ----------           ------- Normal Parathyroid    Normal               Normal Hypoparathyroidism    Low or Low Normal    Low Hyperparathyroidism    Primary            Normal or High       High    Secondary          High                 Normal or Low    Tertiary           High                 High Non-Parathyroid    Hypercalcemia      Low or Low Normal    High .    Calcium 11.0 (H) 8.6 - 10.4 mg/dL  Phosphorus     Status: Abnormal   Collection Time: 03/27/18  9:43 AM  Result Value Ref Range   Phosphorus 2.2 (L) 2.5 - 4.5 mg/dL  Magnesium     Status: None   Collection Time: 03/27/18  9:43 AM  Result Value Ref Range   Magnesium 2.1 1.5 - 2.5 mg/dL  T4, free     Status: None   Collection Time: 03/27/18  9:43 AM  Result Value Ref Range   Free T4 1.0 0.8 - 1.8 ng/dL  TSH     Status: None   Collection Time: 03/27/18  9:43 AM  Result Value Ref Range   TSH 2.56 mIU/L    Comment:           Reference Range .           > or = 20 Years  0.40-4.50 .                Pregnancy Ranges           First trimester    0.26-2.66           Second trimester   0.55-2.73           Third trimester    0.43-2.91   VITAMIN D 25  Hydroxy (Vit-D Deficiency, Fractures)     Status: Abnormal   Collection Time: 03/27/18  9:43 AM  Result Value Ref Range   Vit D, 25-Hydroxy 26 (L) 30 - 100 ng/mL    Comment: Vitamin D Status         25-OH Vitamin D: . Deficiency:                    <20 ng/mL Insufficiency:              20 - 29 ng/mL Optimal:                 > or = 30 ng/mL . For 25-OH Vitamin D testing on patients on  D2-supplementation and patients for whom quantitation  of D2 and D3 fractions is required, the QuestAssureD(TM) 25-OH VIT D, (D2,D3), LC/MS/MS is recommended: order  code 980-172-9065 (patients >49yrs). . For more information on this test, go to: http://education.questdiagnostics.com/faq/FAQ163 (This link is being provided for  informational/educational purposes only.)   Calcium, urine, 24 hour     Status: None   Collection Time: 03/27/18  9:43 AM  Result Value Ref Range   Calcium, 24H Urine 179 mg/24 h    Comment:                           Reference Range  35-250                            Low calcium diet 35-200       Diabetic Labs (most recent): Lab Results  Component Value Date   HGBA1C 5.3 02/13/2018     Lipid Panel ( most recent) Lipid Panel     Component Value Date/Time   CHOL 191 02/13/2018 0857   TRIG 37 02/13/2018 0857   HDL 75 02/13/2018 0857   CHOLHDL 2.5 02/13/2018 0857   CHOLHDL 3.2 12/15/2014 1635   VLDL 11 12/15/2014 1635   LDLCALC 109 (H) 02/13/2018 0857      Lab Results  Component Value Date   TSH 2.56 03/27/2018   TSH 3.130 02/13/2018   TSH 2.453 12/15/2014   FREET4 1.0 03/27/2018      Assessment: 1.  Primary hyperparathyroidism with hypercalcemia 2.  Osteopenia  Plan: Patient has had several instances of elevated calcium, with the highest level being at 11.4 mg/dL. -Most recent labs show still high calcium of 11 associated with high PTH of 121.   -He has mild vitamin D insufficiency, she is on vitamin D3 supplement. -She has dealt with hypercalcemia for at least 3 years with  apparent complication of nephrolithiasis, now osteopenia.    She denies any history of fragility fractures abdominal pain, major mood disorders, bone pain.   -She has primary hyperparathyroidism complicated by hypercalcemia, nephrolithiasis, osteopenia.  She is a  surgical candidate.  I discussed and referred her to Dr. Armandina Gemma in [place] for possible parathyroidectomy.  - Return in about 3 months (around 07/08/2018) for follow up with pre-visit labs.   Glade Lloyd, MD [facility] [address] [place], [place] [address] Phone: [phone]  Fax: [phone]    This note was partially dictated with voice recognition software. Similar sounding words can be transcribed inadequately or may not  be corrected upon review.  04/07/2018, 11:58 AM

## 2018-04-22 DIAGNOSIS — E210 Primary hyperparathyroidism: Secondary | ICD-10-CM | POA: Diagnosis not present

## 2018-04-29 ENCOUNTER — Other Ambulatory Visit (HOSPITAL_COMMUNITY): Payer: Self-pay | Admitting: Surgery

## 2018-04-29 DIAGNOSIS — E210 Primary hyperparathyroidism: Secondary | ICD-10-CM

## 2018-05-08 ENCOUNTER — Ambulatory Visit (HOSPITAL_COMMUNITY)
Admission: RE | Admit: 2018-05-08 | Discharge: 2018-05-08 | Disposition: A | Discharge status: Home / Self Care | Payer: BLUE CROSS/BLUE SHIELD | Source: Ambulatory Visit | Attending: Surgery | Admitting: Surgery

## 2018-05-08 ENCOUNTER — Encounter (HOSPITAL_COMMUNITY)
Admission: RE | Admit: 2018-05-08 | Discharge: 2018-05-08 | Disposition: A | Discharge status: Home / Self Care | Payer: BLUE CROSS/BLUE SHIELD | Source: Ambulatory Visit | Attending: Surgery | Admitting: Surgery

## 2018-05-08 DIAGNOSIS — E210 Primary hyperparathyroidism: Secondary | ICD-10-CM | POA: Diagnosis not present

## 2018-05-08 DIAGNOSIS — D351 Benign neoplasm of parathyroid gland: Secondary | ICD-10-CM | POA: Diagnosis not present

## 2018-05-08 DIAGNOSIS — E213 Hyperparathyroidism, unspecified: Secondary | ICD-10-CM | POA: Diagnosis not present

## 2018-05-08 MED ORDER — TECHNETIUM TC 99M SESTAMIBI GENERIC - CARDIOLITE
26.6000 | Freq: Once | INTRAVENOUS | Status: AC | PRN
  Administered 2018-05-08: 26.6 via INTRAVENOUS

## 2018-05-12 ENCOUNTER — Telehealth: Payer: Self-pay | Admitting: Adult Health

## 2018-05-12 NOTE — Telephone Encounter (Signed)
Pt would like to get a call back from [place]

## 2018-05-12 NOTE — Telephone Encounter (Signed)
Dawson called has bleeding this month after provera and some discomfort in right side, this is normal when cycling with provera, keep log of any bleeding and keep appt with Dr Elonda Husky in July.Discussed with him.

## 2018-06-15 ENCOUNTER — Other Ambulatory Visit: Payer: Self-pay | Admitting: Surgery

## 2018-06-15 DIAGNOSIS — E210 Primary hyperparathyroidism: Secondary | ICD-10-CM | POA: Diagnosis not present

## 2018-06-19 ENCOUNTER — Ambulatory Visit (INDEPENDENT_AMBULATORY_CARE_PROVIDER_SITE_OTHER): Payer: BLUE CROSS/BLUE SHIELD | Admitting: Obstetrics & Gynecology

## 2018-06-19 ENCOUNTER — Encounter: Payer: Self-pay | Admitting: Obstetrics & Gynecology

## 2018-06-19 ENCOUNTER — Other Ambulatory Visit: Payer: Self-pay

## 2018-06-19 VITALS — BP 124/74 | HR 66 | Ht 66.0 in | Wt 142.0 lb

## 2018-06-19 DIAGNOSIS — R1031 Right lower quadrant pain: Secondary | ICD-10-CM | POA: Diagnosis not present

## 2018-06-19 DIAGNOSIS — N924 Excessive bleeding in the premenopausal period: Secondary | ICD-10-CM | POA: Diagnosis not present

## 2018-06-19 NOTE — Progress Notes (Signed)
Chief Complaint  Patient presents with  . Follow-up      52 y.o. G3P3 No LMP recorded. (Menstrual status: Irregular Periods). The current method of family planning is tubal ligation.  Outpatient Encounter Medications as of 06/19/2018  Medication Sig  . Cholecalciferol 5000 units capsule Take 1 capsule (5,000 Units total) by mouth daily.  . [DISCONTINUED] medroxyPROGESTERone (PROVERA) 10 MG tablet Take 1 tablet (10 mg total) by mouth daily. (Patient not taking: Reported on 07/06/2018)  . [DISCONTINUED] OVER THE COUNTER MEDICATION Release dietary supplement   No facility-administered encounter medications on file as of 06/19/2018.     Subjective Nancy Barrett is in for follow up related to her peri menopausal management She has some bleeding with thickened endometrium on sonogram followed byu a EMB revealing proliferative endometrium without hyperplasia or atypia I placed her on cyclical provera, 10 days monthly to provide endometrial suppression She is here today for 3 month eval She has been having predictable bleeding 2 out of 3 months none the third  She has been having some sharp RLQ pain as well Worse with certain movementsand she is very physically active Past Medical History:  Diagnosis Date  . Anemia   . Bronchitis   . Cervical high risk human papillomavirus (HPV) DNA test positive 03/02/2018  . Fibroids 12/26/2014  . Hot flashes 12/15/2014  . Irregular periods 12/15/2014  . Melanoma ([place])    nose, removed  . Ovarian cyst 12/26/2014  . Peri-menopause 12/15/2014  . Thickened endometrium 03/03/2018   ?polyp, will get endo bx    Past Surgical History:  Procedure Laterality Date  . PARATHYROIDECTOMY N/A 07/17/2018   Procedure: MINIMALLY INVASIVE PARATHYROIDECTOMY ERAS PATHWAY;  Surgeon: Coralie Keens, MD;  Location: [facility];  Service: General;  Laterality: N/A;  . SKIN CANCER EXCISION  2014   removed from nose  . TUBAL LIGATION      OB History    Gravida  3   Para  3   Term      Preterm      AB      Living  3     SAB      TAB      Ectopic      Multiple      Live Births              Allergies  Allergen Reactions  . Amoxicillin Hives    Has patient had a PCN reaction causing immediate rash, facial/tongue/throat swelling, SOB or lightheadedness with hypotension: Unknown Has patient had a PCN reaction causing severe rash involving mucus membranes or skin necrosis: Unknown Has patient had a PCN reaction that required hospitalization: No Has patient had a PCN reaction occurring within the last 10 years: No If all of the above answers are "NO", then may proceed with Cephalosporin use.    Marland Kitchen Penicillins Hives    Has patient had a PCN reaction causing immediate rash, facial/tongue/throat swelling, SOB or lightheadedness with hypotension: Unknown Has patient had a PCN reaction causing severe rash involving mucus membranes or skin necrosis: Unknown Has patient had a PCN reaction that required hospitalization: No Has patient had a PCN reaction occurring within the last 10 years: No If all of the above answers are "NO", then may proceed with Cephalosporin use.     Social History   Socioeconomic History  . Marital status: Married    Spouse name: Not on file  . Number of children: Not on file  .  Years of education: Not on file  . Highest education level: Not on file  Occupational History  . Not on file  Social Needs  . Financial resource strain: Not on file  . Food insecurity:    Worry: Not on file    Inability: Not on file  . Transportation needs:    Medical: Not on file    Non-medical: Not on file  Tobacco Use  . Smoking status: Never Smoker  . Smokeless tobacco: Never Used  Substance and Sexual Activity  . Alcohol use: No  . Drug use: No  . Sexual activity: Yes    Birth control/protection: None, Surgical    Comment: tubal  Lifestyle  . Physical activity:    Days per week: Not on file    Minutes per session:  Not on file  . Stress: Not on file  Relationships  . Social connections:    Talks on phone: Not on file    Gets together: Not on file    Attends religious service: Not on file    Active member of club or organization: Not on file    Attends meetings of clubs or organizations: Not on file    Relationship status: Not on file  Other Topics Concern  . Not on file  Social History Narrative  . Not on file    Family History  Problem Relation Age of Onset  . Bronchitis Mother   . Arthritis Mother   . Hypertension Mother   . Hyperlipidemia Mother   . Heart attack Father   . Heart attack Paternal Uncle   . Other Maternal Grandfather        stomach problems  . Cancer Maternal Grandfather        colon cancer  . Heart attack Paternal Grandmother   . Stroke Paternal Grandfather   . Hypertension Brother   . Hypertension Brother   . Hypertension Brother   . Cancer Maternal Uncle        colon cancer  . Colon cancer Maternal Uncle     Medications:       Current Outpatient Medications:  .  Cholecalciferol 5000 units capsule, Take 1 capsule (5,000 Units total) by mouth daily., Disp: , Rfl:  .  guaiFENesin (MUCINEX) 600 MG 12 hr tablet, Take 600 mg by mouth 2 (two) times daily as needed (for sinus issues.)., Disp: , Rfl:  .  ibuprofen (ADVIL,MOTRIN) 200 MG tablet, Take 400 mg by mouth every 8 (eight) hours as needed (for pain.)., Disp: , Rfl:  .  oxyCODONE (OXY IR/ROXICODONE) 5 MG immediate release tablet, Take 1 tablet (5 mg total) by mouth every 6 (six) hours as needed for moderate pain, severe pain or breakthrough pain., Disp: 20 tablet, Rfl: 0  Objective Blood pressure 124/74, pulse 66, height 5\' 6"  (1.676 m), weight 142 lb (64.4 kg).  Gen WDWN NAD Tenderness in the interface of rectus abdominus and transversalis consisitent with nerve ganglion of MS source of pain  Pertinent ROS No burning with urination, frequency or urgency No nausea, vomiting or diarrhea Nor fever chills or  other constitutional symptoms   Labs or studies No new    Impression Diagnoses this Encounter::   ICD-10-CM   1. Abnormal perimenopausal bleeding N92.4   2. Abdominal wall pain in right lower quadrant R10.31     Established relevant diagnosis(es):   Plan/Recommendations: No orders of the defined types were placed in this encounter.   Labs or Scans Ordered: No orders of the defined  types were placed in this encounter.   Management:: Use topical lidoderm patch cut to size for the abdominal wall musculoskeletal pain  Having some cyclical bleeding, continue the provera 10 days monthly until no bleeding for 1 year the  Follow up Return in about 1 year (around 06/20/2019) for yearly with Anderson Malta.        Face to face time:  10 minutes  Greater than 50% of the visit time was spent in counseling and coordination of care with the patient.  The summary and outline of the counseling and care coordination is summarized in the note above.   All questions were answered.

## 2018-06-26 ENCOUNTER — Ambulatory Visit: Payer: BLUE CROSS/BLUE SHIELD | Admitting: Obstetrics & Gynecology

## 2018-07-09 ENCOUNTER — Ambulatory Visit: Payer: BLUE CROSS/BLUE SHIELD | Admitting: "Endocrinology

## 2018-07-10 NOTE — Patient Instructions (Addendum)
Nancy Barrett  07/10/2018   Your procedure is scheduled on: 07-17-18   Report to [facility] Main  Entrance    Report to Admitting at 7:00 AM    Call this number if you have problems the morning of surgery [phone]    Remember: NO SOLID FOOD AFTER MIDNIGHT THE NIGHT PRIOR TO SURGERY. NOTHING BY MOUTH EXCEPT CLEAR LIQUIDS UNTIL 3 HOURS PRIOR TO [place] SURGERY. PLEASE FINISH ENSURE DRINK PER SURGEON ORDER 3 HOURS PRIOR TO SCHEDULED SURGERY TIME WHICH NEEDS TO BE COMPLETED AT 6:00 AM.     CLEAR LIQUID DIET   Foods Allowed                                                                     Foods Excluded  Coffee and tea, regular and decaf                             liquids that you cannot  Plain Jell-O in any flavor                                             see through such as: Fruit ices (not with fruit pulp)                                     milk, soups, orange juice  Iced Popsicles                                    All solid food Carbonated beverages, regular and diet                                    Cranberry, grape and apple juices Sports drinks like Gatorade Lightly seasoned clear broth or consume(fat free) Sugar, honey syrup  Sample Menu Breakfast                                Lunch                                     Supper Cranberry juice                    Beef broth                            Chicken broth Jell-O                                     Grape juice  Apple juice Coffee or tea                        Jell-O                                      Popsicle                                                Coffee or tea                        Coffee or tea  _____________________________________________________________________    Take these medicines the morning of surgery with A SIP OF WATER: None                                You may not have any metal on your body including hair pins and               piercings  Do not wear jewelry, make-up, lotions, powders or perfumes, deodorant             Do not wear nail polish.  Do not shave  48 hours prior to surgery.                 Do not bring valuables to the hospital. [place].  Contacts, dentures or bridgework may not be worn into surgery.      Patients discharged the day of surgery will not be allowed to drive home.  Name and phone number of your driver: Nancy Barrett [phone]                Please read over the following fact sheets you were given: _____________________________________________________________________             [facility] - Preparing for Surgery Before surgery, you can play an important role.  Because skin is not sterile, your skin needs to be as free of germs as possible.  You can reduce the number of germs on your skin by washing with CHG (chlorahexidine gluconate) soap before surgery.  CHG is an antiseptic cleaner which kills germs and bonds with the skin to continue killing germs even after washing. Please DO NOT use if you have an allergy to CHG or antibacterial soaps.  If your skin becomes reddened/irritated stop using the CHG and inform your nurse when you arrive at Short Stay. Do not shave (including legs and underarms) for at least 48 hours prior to the first CHG shower.  You may shave your face/neck. Please follow these instructions carefully:  1.  Shower with CHG Soap the night before surgery and the  morning of Surgery.  2.  If you choose to wash your hair, wash your hair first as usual with your  normal  shampoo.  3.  After you shampoo, rinse your hair and body thoroughly to remove the  shampoo.                           4.  Use  CHG as you would any other liquid soap.  You can apply chg directly  to the skin and wash                       Gently with a scrungie or clean washcloth.  5.  Apply the CHG Soap to your body ONLY FROM THE NECK DOWN.   Do not  use on face/ open                           Wound or open sores. Avoid contact with eyes, ears mouth and genitals (private parts).                       Wash face,  Genitals (private parts) with your normal soap.             6.  Wash thoroughly, paying special attention to the area where your surgery  will be performed.  7.  Thoroughly rinse your body with warm water from the neck down.  8.  DO NOT shower/wash with your normal soap after using and rinsing off  the CHG Soap.                9.  Pat yourself dry with a clean towel.            10.  Wear clean pajamas.            11.  Place clean sheets on your bed the night of your first shower and do not  sleep with pets. Day of Surgery : Do not apply any lotions/deodorants the morning of surgery.  Please wear clean clothes to the hospital/surgery center.  FAILURE TO FOLLOW THESE INSTRUCTIONS MAY RESULT IN THE CANCELLATION OF YOUR SURGERY PATIENT SIGNATURE_________________________________  NURSE SIGNATURE__________________________________  ________________________________________________________________________

## 2018-07-13 ENCOUNTER — Encounter (HOSPITAL_COMMUNITY)
Admission: RE | Admit: 2018-07-13 | Discharge: 2018-07-13 | Disposition: A | Discharge status: Home / Self Care | Payer: BLUE CROSS/BLUE SHIELD | Source: Ambulatory Visit | Attending: Surgery | Admitting: Surgery

## 2018-07-13 ENCOUNTER — Other Ambulatory Visit: Payer: Self-pay

## 2018-07-13 ENCOUNTER — Encounter (HOSPITAL_COMMUNITY): Payer: Self-pay

## 2018-07-13 DIAGNOSIS — D367 Benign neoplasm of other specified sites: Secondary | ICD-10-CM | POA: Diagnosis not present

## 2018-07-13 DIAGNOSIS — Z01812 Encounter for preprocedural laboratory examination: Secondary | ICD-10-CM | POA: Insufficient documentation

## 2018-07-13 LAB — CBC
HCT: 41.5 % (ref 36.0–46.0)
Hemoglobin: 13.6 g/dL (ref 12.0–15.0)
MCH: 29.4 pg (ref 26.0–34.0)
MCHC: 32.8 g/dL (ref 30.0–36.0)
MCV: 89.8 fL (ref 78.0–100.0)
PLATELETS: 172 10*3/uL (ref 150–400)
RBC: 4.62 MIL/uL (ref 3.87–5.11)
RDW: 12.7 % (ref 11.5–15.5)
WBC: 4.4 10*3/uL (ref 4.0–10.5)

## 2018-07-13 LAB — PREGNANCY, URINE: PREG TEST UR: NEGATIVE

## 2018-07-16 NOTE — H&P (Signed)
Nancy Barrett  Location: [facility] Patient #: [id number] DOB: Apr 26, 1966 Married / Language: English / Race: White Female  History:  The patient is a 52 year old female who presents with a parathyroid neoplasm. This is a pleasant patient referred by endocrinologist Dr. Loni Beckwith for primary hyperparathyroidism. For several years, she has had persistent elevated calcium levels. She has had some fatigue and kidney stones and now some osteopenia. She had a complete and thorough workup by the endocrinologist. She is now referred for surgical consideration. She is otherwise healthy and doing well. Her calcium level has been as high as 11.4 and her PTH has been 121.   Past Surgical History Tonsillectomy   Diagnostic Studies History  Colonoscopy  never Mammogram  within last year Pap Smear  1-5 years ago  Allergies  Penicillins  AMOXICILLIN   Medication History  MedroxyPROGESTERone Acetate (10MG  Tablet, Oral) Active. Cholecalciferol (5000UNIT Capsule, Oral) Active. Azo Confidence (Oral) Active. Medications Reconciled  Social History  Alcohol use  Occasional alcohol use. Caffeine use  Carbonated beverages, Tea. No drug use  Tobacco use  Never smoker.  Family History  Arthritis  Mother. Heart Disease  Father. Heart disease in female family member before age 54  Hypertension  Brother.  Pregnancy / Birth History  Age at menarche  14 years. Age of menopause  20-55 Gravida  3 Length (months) of breastfeeding  3-6 Maternal age  80-25 Para  37  Other Problems  Anxiety Disorder  Kidney Stone  Lump In Breast     Review of Systems General Present- Weight Gain. Not Present- Appetite Loss, Chills, Fatigue, Fever, Night Sweats and Weight Loss. Skin Not Present- Change in Wart/Mole, Dryness, Hives, Jaundice, New Lesions, Non-Healing Wounds, Rash and Ulcer. HEENT Present- Seasonal Allergies, Sinus Pain, Visual Disturbances and  Wears glasses/contact lenses. Not Present- Earache, Hearing Loss, Hoarseness, Nose Bleed, Oral Ulcers, Ringing in the Ears, Sore Throat and Yellow Eyes. Breast Present- Breast Mass. Not Present- Breast Pain, Nipple Discharge and Skin Changes. Cardiovascular Present- Difficulty Breathing Lying Down, Leg Cramps and Swelling of Extremities. Not Present- Chest Pain, Palpitations, Rapid Heart Rate and Shortness of Breath. Gastrointestinal Present- Constipation. Not Present- Abdominal Pain, Bloating, Bloody Stool, Change in Bowel Habits, Chronic diarrhea, Difficulty Swallowing, Excessive gas, Gets full quickly at meals, Hemorrhoids, Indigestion, Nausea, Rectal Pain and Vomiting. Psychiatric Present- Anxiety. Not Present- Bipolar, Change in Sleep Pattern, Depression, Fearful and Frequent crying.  Vitals  Weight: 147.38 lb Height: 65in Body Surface Area: 1.74 m Body Mass Index: 24.52 kg/m  Temp.: 98.47F  Pulse: 78 (Regular)  P.OX: 99% (Room air) BP: 140/84 (Sitting, Left Arm, Standard)       Physical Exam  The physical exam findings are as follows: Note:On examination, she is well appearance. There are no palpable neck masses and no cervical adenopathy. Oropharynx is clear. Lungs clear CV RRR Abdomen soft ,NT Skin without rashes  I have reviewed the notes from her endocrinologist or her laboratory data    [place] (E21.0)  Impression: I had a discussion with the patient and her husband regarding primary hyperparathyroidism and parathyroid adenomas. The next step in the process would be localization of a potential adenoma. We will order an ultrasound the neck and a sestamibi scan this evening to be localized. I discussed minimally invasive parathyroidectomy with her in detail. I will call her back after results of the studies and we will determine where to proceed from there.  Addendum: The  patient is a 52 year old female who presents  with a parathyroid neoplasm. She is here for a follow-up regarding her possible parathyroid adenoma. She has had a sestamibi scan which shows possible enlarged left superior parathyroid gland. An ultrasound shows a 1.6 cm nodule in the left superior neck adjacent to the superior lobe of the thyroid gland consistent also with an adenoma. e discussed the ultrasound and sestamibi scan findings. Again, this is consistent with a left superior parathyroid adenoma. We discussed proceeding with a minimally invasive parathyroidectomy. I discussed the surgical procedure in detail. I discussed the risks of the surgery which includes but is not limited to bleeding, infection, postoperative hypocalcemia, injury to surrounding structures, injury to the nerves of the vocal cord, hoarseness, pulmonary issues, DVT, etc. We also discussed the possibilities of having more than 1 adenoma. She understands and wished to proceed with surgery which will be scheduled

## 2018-07-17 ENCOUNTER — Ambulatory Visit (HOSPITAL_COMMUNITY): Payer: BLUE CROSS/BLUE SHIELD | Admitting: Registered Nurse

## 2018-07-17 ENCOUNTER — Encounter (HOSPITAL_COMMUNITY): Payer: Self-pay | Admitting: *Deleted

## 2018-07-17 ENCOUNTER — Encounter (HOSPITAL_COMMUNITY)
Admission: RE | Disposition: A | Discharge status: Home / Self Care | Payer: Self-pay | Source: Ambulatory Visit | Attending: Surgery

## 2018-07-17 ENCOUNTER — Other Ambulatory Visit: Payer: Self-pay

## 2018-07-17 ENCOUNTER — Ambulatory Visit (HOSPITAL_COMMUNITY)
Admission: RE | Admit: 2018-07-17 | Discharge: 2018-07-17 | Disposition: A | Discharge status: Home / Self Care | Payer: BLUE CROSS/BLUE SHIELD | Source: Ambulatory Visit | Attending: Surgery | Admitting: Surgery

## 2018-07-17 DIAGNOSIS — E559 Vitamin D deficiency, unspecified: Secondary | ICD-10-CM | POA: Diagnosis not present

## 2018-07-17 DIAGNOSIS — F419 Anxiety disorder, unspecified: Secondary | ICD-10-CM | POA: Diagnosis not present

## 2018-07-17 DIAGNOSIS — E210 Primary hyperparathyroidism: Secondary | ICD-10-CM | POA: Insufficient documentation

## 2018-07-17 DIAGNOSIS — Z79899 Other long term (current) drug therapy: Secondary | ICD-10-CM | POA: Insufficient documentation

## 2018-07-17 DIAGNOSIS — D351 Benign neoplasm of parathyroid gland: Secondary | ICD-10-CM | POA: Diagnosis not present

## 2018-07-17 DIAGNOSIS — M8580 Other specified disorders of bone density and structure, unspecified site: Secondary | ICD-10-CM | POA: Diagnosis not present

## 2018-07-17 HISTORY — PX: PARATHYROIDECTOMY: SHX19

## 2018-07-17 SURGERY — PARATHYROIDECTOMY
Anesthesia: General

## 2018-07-17 MED ORDER — FENTANYL CITRATE (PF) 250 MCG/5ML IJ SOLN
INTRAMUSCULAR | Status: DC | PRN
  Administered 2018-07-17: 100 ug via INTRAVENOUS
  Administered 2018-07-17 (×2): 50 ug via INTRAVENOUS

## 2018-07-17 MED ORDER — FENTANYL CITRATE (PF) 250 MCG/5ML IJ SOLN
INTRAMUSCULAR | Status: AC
Start: 2018-07-17 — End: ?
  Filled 2018-07-17: qty 5

## 2018-07-17 MED ORDER — PROPOFOL 10 MG/ML IV BOLUS
INTRAVENOUS | Status: AC
  Filled 2018-07-17: qty 20

## 2018-07-17 MED ORDER — ONDANSETRON HCL 4 MG/2ML IJ SOLN
INTRAMUSCULAR | Status: AC
  Filled 2018-07-17: qty 2

## 2018-07-17 MED ORDER — GABAPENTIN 300 MG PO CAPS
300.0000 mg | ORAL_CAPSULE | ORAL | Status: AC
  Administered 2018-07-17: 300 mg via ORAL
  Filled 2018-07-17: qty 1

## 2018-07-17 MED ORDER — DEXAMETHASONE SODIUM PHOSPHATE 10 MG/ML IJ SOLN
INTRAMUSCULAR | Status: DC | PRN
  Administered 2018-07-17: 10 mg via INTRAVENOUS

## 2018-07-17 MED ORDER — LIDOCAINE 2% (20 MG/ML) 5 ML SYRINGE
INTRAMUSCULAR | Status: AC
  Filled 2018-07-17: qty 5

## 2018-07-17 MED ORDER — OXYCODONE HCL 5 MG PO TABS
5.0000 mg | ORAL_TABLET | Freq: Four times a day (QID) | ORAL | 0 refills | Status: DC | PRN
Start: 2018-07-17 — End: 2018-09-07

## 2018-07-17 MED ORDER — SUGAMMADEX SODIUM 200 MG/2ML IV SOLN
INTRAVENOUS | Status: DC | PRN
  Administered 2018-07-17: 130 mg via INTRAVENOUS

## 2018-07-17 MED ORDER — SODIUM CHLORIDE 0.9 % IR SOLN
Status: DC | PRN
  Administered 2018-07-17: 1000 mL

## 2018-07-17 MED ORDER — ROCURONIUM BROMIDE 10 MG/ML (PF) SYRINGE
PREFILLED_SYRINGE | INTRAVENOUS | Status: DC | PRN
  Administered 2018-07-17: 40 mg via INTRAVENOUS

## 2018-07-17 MED ORDER — ACETAMINOPHEN 500 MG PO TABS
1000.0000 mg | ORAL_TABLET | ORAL | Status: AC
  Administered 2018-07-17: 1000 mg via ORAL
  Filled 2018-07-17: qty 2

## 2018-07-17 MED ORDER — LACTATED RINGERS IV SOLN
INTRAVENOUS | Status: DC
  Administered 2018-07-17: 08:00:00 via INTRAVENOUS

## 2018-07-17 MED ORDER — CIPROFLOXACIN IN D5W 400 MG/200ML IV SOLN
400.0000 mg | INTRAVENOUS | Status: AC
  Administered 2018-07-17: 400 mg via INTRAVENOUS
  Filled 2018-07-17: qty 200

## 2018-07-17 MED ORDER — ONDANSETRON HCL 4 MG/2ML IJ SOLN
INTRAMUSCULAR | Status: DC | PRN
  Administered 2018-07-17: 4 mg via INTRAVENOUS

## 2018-07-17 MED ORDER — LIDOCAINE 2% (20 MG/ML) 5 ML SYRINGE
INTRAMUSCULAR | Status: DC | PRN
  Administered 2018-07-17: 60 mg via INTRAVENOUS

## 2018-07-17 MED ORDER — CHLORHEXIDINE GLUCONATE CLOTH 2 % EX PADS: 6.0000 | MEDICATED_PAD | Freq: Once | CUTANEOUS | Status: DC

## 2018-07-17 MED ORDER — HYDROMORPHONE HCL 1 MG/ML IJ SOLN: 0.2500 mg | INTRAMUSCULAR | Status: DC | PRN

## 2018-07-17 MED ORDER — MIDAZOLAM HCL 5 MG/5ML IJ SOLN
INTRAMUSCULAR | Status: DC | PRN
  Administered 2018-07-17: 2 mg via INTRAVENOUS

## 2018-07-17 MED ORDER — CELECOXIB 200 MG PO CAPS
200.0000 mg | ORAL_CAPSULE | ORAL | Status: AC
  Administered 2018-07-17: 200 mg via ORAL
  Filled 2018-07-17: qty 1

## 2018-07-17 MED ORDER — PROPOFOL 10 MG/ML IV BOLUS
INTRAVENOUS | Status: DC | PRN
  Administered 2018-07-17: 130 mg via INTRAVENOUS

## 2018-07-17 MED ORDER — BUPIVACAINE HCL (PF) 0.5 % IJ SOLN
INTRAMUSCULAR | Status: DC | PRN
  Administered 2018-07-17: 10 mL

## 2018-07-17 MED ORDER — BUPIVACAINE HCL (PF) 0.5 % IJ SOLN
INTRAMUSCULAR | Status: AC
  Filled 2018-07-17: qty 30

## 2018-07-17 MED ORDER — SUGAMMADEX SODIUM 200 MG/2ML IV SOLN
INTRAVENOUS | Status: AC
  Filled 2018-07-17: qty 2

## 2018-07-17 MED ORDER — ROCURONIUM BROMIDE 10 MG/ML (PF) SYRINGE
PREFILLED_SYRINGE | INTRAVENOUS | Status: AC
  Filled 2018-07-17: qty 10

## 2018-07-17 MED ORDER — DEXAMETHASONE SODIUM PHOSPHATE 10 MG/ML IJ SOLN
INTRAMUSCULAR | Status: AC
  Filled 2018-07-17: qty 1

## 2018-07-17 MED ORDER — ALUM & MAG HYDROXIDE-SIMETH 200-200-20 MG/5ML PO SUSP
15.0000 mL | Freq: Once | ORAL | Status: AC
  Administered 2018-07-17: 15 mL via ORAL
  Filled 2018-07-17: qty 30

## 2018-07-17 MED ORDER — MIDAZOLAM HCL 2 MG/2ML IJ SOLN
INTRAMUSCULAR | Status: AC
  Filled 2018-07-17: qty 2

## 2018-07-17 SURGICAL SUPPLY — 40 items
ADH SKN CLS APL DERMABOND .7 (GAUZE/BANDAGES/DRESSINGS) ×1
APL SKNCLS STERI-STRIP NONHPOA (GAUZE/BANDAGES/DRESSINGS) ×1
ATTRACTOMAT 16X20 MAGNETIC DRP (DRAPES) ×2 IMPLANT
BENZOIN TINCTURE PRP APPL 2/3 (GAUZE/BANDAGES/DRESSINGS) ×2 IMPLANT
BLADE HEX COATED 2.75 (ELECTRODE) ×2 IMPLANT
BLADE SURG 15 STRL LF DISP TIS (BLADE) ×1 IMPLANT
BLADE SURG 15 STRL SS (BLADE) ×2
BLADE SURG SZ10 CARB STEEL (BLADE) IMPLANT
CLIP VESOCCLUDE SM WIDE 6/CT (CLIP) ×4 IMPLANT
DECANTER SPIKE VIAL GLASS SM (MISCELLANEOUS) ×2 IMPLANT
DERMABOND ADVANCED (GAUZE/BANDAGES/DRESSINGS) ×1
DERMABOND ADVANCED .7 DNX12 (GAUZE/BANDAGES/DRESSINGS) IMPLANT
DISSECTOR ROUND CHERRY 3/8 STR (MISCELLANEOUS) ×2 IMPLANT
DRAPE LAPAROTOMY T 98X78 PEDS (DRAPES) ×2 IMPLANT
ELECT PENCIL ROCKER SW 15FT (MISCELLANEOUS) ×2 IMPLANT
ELECT REM PT RETURN 15FT ADLT (MISCELLANEOUS) ×2 IMPLANT
GAUZE 4X4 16PLY RFD (DISPOSABLE) ×2 IMPLANT
GAUZE SPONGE 4X4 12PLY STRL (GAUZE/BANDAGES/DRESSINGS) IMPLANT
GLOVE SURG SIGNA 7.5 PF LTX (GLOVE) ×4 IMPLANT
GOWN STRL REUS W/TWL LRG LVL3 (GOWN DISPOSABLE) ×2 IMPLANT
GOWN STRL REUS W/TWL XL LVL3 (GOWN DISPOSABLE) ×4 IMPLANT
HEMOSTAT SURGICEL 2X14 (HEMOSTASIS) ×1 IMPLANT
KIT BASIN OR (CUSTOM PROCEDURE TRAY) ×2 IMPLANT
MARKER SKIN DUAL TIP RULER LAB (MISCELLANEOUS) ×2 IMPLANT
NS IRRIG 1000ML POUR BTL (IV SOLUTION) ×1 IMPLANT
PACK BASIC VI WITH GOWN DISP (CUSTOM PROCEDURE TRAY) ×2 IMPLANT
SHEARS HARMONIC 9CM CVD (BLADE) ×2 IMPLANT
STAPLER VISISTAT 35W (STAPLE) IMPLANT
STRIP CLOSURE SKIN 1/2X4 (GAUZE/BANDAGES/DRESSINGS) ×1 IMPLANT
SUT MNCRL AB 4-0 PS2 18 (SUTURE) ×2 IMPLANT
SUT SILK 2 0 (SUTURE) ×2
SUT SILK 2-0 18XBRD TIE 12 (SUTURE) ×1 IMPLANT
SUT SILK 3 0 (SUTURE)
SUT SILK 3-0 18XBRD TIE 12 (SUTURE) IMPLANT
SUT VIC AB 3-0 SH 18 (SUTURE) ×2 IMPLANT
SUT VICRYL 2 0 18  UND BR (SUTURE)
SUT VICRYL 2 0 18 UND BR (SUTURE) IMPLANT
SYR BULB IRRIGATION 50ML (SYRINGE) ×2 IMPLANT
TOWEL OR 17X26 10 PK STRL BLUE (TOWEL DISPOSABLE) ×2 IMPLANT
YANKAUER SUCT BULB TIP 10FT TU (MISCELLANEOUS) ×2 IMPLANT

## 2018-07-17 NOTE — Transfer of Care (Signed)
Immediate Anesthesia Transfer of Care Note  Patient: Nancy Barrett  Procedure(s) Performed: MINIMALLY INVASIVE PARATHYROIDECTOMY ERAS PATHWAY (N/A )  Patient Location: PACU  Anesthesia Type:General  Level of Consciousness: sedated  Airway & Oxygen Therapy: Patient Spontanous Breathing and Patient connected to face mask oxygen  Post-op Assessment: Report given to RN and Post -op Vital signs reviewed and stable  Post vital signs: Reviewed and stable  Last Vitals:  Vitals Value Taken Time  BP 145/79 07/17/2018 10:01 AM  Temp    Pulse 65 07/17/2018 10:01 AM  Resp 13 07/17/2018 10:01 AM  SpO2 97 % 07/17/2018 10:01 AM  Vitals shown include unvalidated device data.  Last Pain:  Vitals:   07/17/18 0719  TempSrc: Oral         Complications: No apparent anesthesia complications

## 2018-07-17 NOTE — Anesthesia Preprocedure Evaluation (Signed)
Anesthesia Evaluation  Patient identified by MRN, date of birth, ID band Patient awake    Reviewed: Allergy & Precautions, H&P , NPO status , Patient's Chart, lab work & pertinent test results  Airway Mallampati: II  TM Distance: >3 FB Neck ROM: Full    Dental no notable dental hx. (+) Teeth Intact, Dental Advisory Given   Pulmonary neg pulmonary ROS,    Pulmonary exam normal breath sounds clear to auscultation       Cardiovascular negative cardio ROS   Rhythm:Regular Rate:Normal     Neuro/Psych negative neurological ROS  negative psych ROS   GI/Hepatic negative GI ROS, Neg liver ROS,   Endo/Other  negative endocrine ROS  Renal/GU negative Renal ROS  negative genitourinary   Musculoskeletal   Abdominal   Peds  Hematology negative hematology ROS (+) anemia ,   Anesthesia Other Findings   Reproductive/Obstetrics negative OB ROS                             Anesthesia Physical Anesthesia Plan  ASA: II  Anesthesia Plan: General   Post-op Pain Management:    Induction: Intravenous  PONV Risk Score and Plan: 4 or greater and Ondansetron, Dexamethasone and Midazolam  Airway Management Planned: Oral ETT  Additional Equipment:   Intra-op Plan:   Post-operative Plan: Extubation in OR  Informed Consent: I have reviewed the patients History and Physical, chart, labs and discussed the procedure including the risks, benefits and alternatives for the proposed anesthesia with the patient or authorized representative who has indicated his/her understanding and acceptance.   Dental advisory given  Plan Discussed with: CRNA  Anesthesia Plan Comments:         Anesthesia Quick Evaluation

## 2018-07-17 NOTE — Op Note (Signed)
MINIMALLY INVASIVE PARATHYROIDECTOMY ERAS PATHWAY  Procedure Note  Nancy Barrett 07/17/2018   Pre-op Diagnosis: parathyroid adenoma     Post-op Diagnosis: same  Procedure(s): MINIMALLY INVASIVE PARATHYROIDECTOMY ERAS PATHWAY  Surgeon(s): Coralie Keens, MD Jovita Kussmaul, MD  Anesthesia: General  Staff:  Circulator: Fayrene Helper, RN Scrub Person: Nathen May, CST  Estimated Blood Loss: Minimal               Specimens: sent to pathology.  Frozen section confirmed what appeared to be a left superior parathyroid adenoma   Indications: [name] with primary hyperparathyroidism.  This was felt to be consistent with a parathyroid adenoma.  She underwent a preoperative sestamibi scan and ultrasound suggesting a 1.6 cm left superior parathyroid adenoma.  Findings: The patient was found to have an enlarged parathyroid in the left superior position.  Frozen section again was performed once it was removed and confirmed parathyroid consistent with an adenoma  Procedure: The patient was brought to the operating room and identified as correct patient.  She was placed supine on the operating table and general anesthesia was induced.  Her neck was then prepped and draped in the usual sterile fashion.  I made a small transverse incision at the patient's lower midline of the neck anteriorly under direct vision.  I carried this down through the platysma with the cautery and created superior and inferior skin flaps.  I then opened the midline over the trachea separating the muscle fibers with the cautery.  I then retracted the muscle fibers laterally and identified the thyroid gland.  I then stayed adjacent to the gland and worked toward the superior pole.  A vein at the superior pole was clipped with surgical clips to achieve hemostasis.  I then identified what appeared to be a large adenoma just behind the superior pole.  I was able to dissected out and then clipped the  vessel to the parathyroid gland with surgical clips and then remove the parathyroid gland in its entirety.  It was then sent to pathology and frozen section was performed confirming a parathyroid and probable adenoma.  This again was in the left superior position.  At this point hemostasis appeared to be achieved.  I irrigated the neck thoroughly with saline.  I then closed the patient's midline with interrupted 3-0 Vicryl sutures.  I then reapproximated the platysma with interrupted 3-0 Vicryl sutures and closed the skin with a running 4-0 Monocryl.  Dermabond was then applied.  The patient tolerated procedure well.  All the counts were correct at the end of the procedure.  The patient was then extubated in the operating room and taken in stable condition to the recovery room.          Sahara Fujimoto A   Date: 07/17/2018  Time: 9:46 AM

## 2018-07-17 NOTE — Interval H&P Note (Signed)
History and Physical Interval Note: no change in H and P  07/17/2018 8:17 AM  Nancy Barrett  has presented today for surgery, with the diagnosis of parathyroid adenoma  The various methods of treatment have been discussed with the patient and family. After consideration of risks, benefits and other options for treatment, the patient has consented to  Procedure(s): MINIMALLY INVASIVE PARATHYROIDECTOMY ERAS PATHWAY (N/A) as a surgical intervention .  The patient's history has been reviewed, patient examined, no change in status, stable for surgery.  I have reviewed the patient's chart and labs.  Questions were answered to the patient's satisfaction.     [name] A

## 2018-07-17 NOTE — Anesthesia Postprocedure Evaluation (Signed)
Anesthesia Post Note  Patient: Nancy Barrett  Procedure(s) Performed: MINIMALLY INVASIVE PARATHYROIDECTOMY ERAS PATHWAY (N/A )     Patient location during evaluation: PACU Anesthesia Type: General Level of consciousness: awake and alert Pain management: pain level controlled Vital Signs Assessment: post-procedure vital signs reviewed and stable Respiratory status: spontaneous breathing, nonlabored ventilation, respiratory function stable and patient connected to nasal cannula oxygen Cardiovascular status: blood pressure returned to baseline and stable Postop Assessment: no apparent nausea or vomiting Anesthetic complications: no    Last Vitals:  Vitals:   07/17/18 1015 07/17/18 1030  BP: 127/65 (!) 144/81  Pulse: 68 75  Resp: (!) 9 20  Temp:    SpO2: 99% 100%    Last Pain:  Vitals:   07/17/18 1030  TempSrc:   PainSc: 0-No pain                 Jadamarie Butson,W. EDMOND

## 2018-07-17 NOTE — Anesthesia Procedure Notes (Signed)
Procedure Name: Intubation Date/Time: 07/17/2018 8:41 AM Performed by: Talbot Grumbling, CRNA Pre-anesthesia Checklist: Patient identified, Emergency Drugs available, Suction available and Patient being monitored Patient Re-evaluated:Patient Re-evaluated prior to induction Oxygen Delivery Method: Circle system utilized Preoxygenation: Pre-oxygenation with 100% oxygen Induction Type: IV induction Ventilation: Mask ventilation without difficulty Laryngoscope Size: Mac and 3 Grade View: Grade I Tube type: Oral Tube size: 7.0 mm Number of attempts: 1 Airway Equipment and Method: Stylet Placement Confirmation: ETT inserted through vocal cords under direct vision,  positive ETCO2 and breath sounds checked- equal and bilateral Secured at: 22 cm Tube secured with: Tape Dental Injury: Teeth and Oropharynx as per pre-operative assessment

## 2018-07-17 NOTE — Discharge Instructions (Signed)
[facility], [place] [phone]  THYROID/ PARATHYROID SURGERY: POST OP INSTRUCTIONS  Always review your discharge instruction sheet given to you by the facility where your surgery was performed.  IF YOU HAVE DISABILITY OR FAMILY LEAVE FORMS, YOU MUST BRING THEM TO THE OFFICE FOR PROCESSING.  PLEASE DO NOT GIVE THEM TO YOUR DOCTOR.  1. A prescription for pain medication may be given to you upon discharge.  Take your pain medication as prescribed, if needed.  If narcotic pain medicine is not needed, then you may take acetaminophen (Tylenol) or ibuprofen (Advil) as needed. 2. Take your usually prescribed medications unless otherwise directed. 3. If you need a refill on your pain medication, please contact your pharmacy. They will contact our office to request authorization.  Prescriptions will not be filled after 5pm or on week-ends. 4. You should follow a light diet the first 24 hours after arrival home, such as soup and crackers, etc.  Be sure to include lots of fluids daily.  Resume your normal diet the day after surgery. 5. Most patients will experience some swelling and bruising on the chest and neck area.  Ice packs will help.  Swelling and bruising can take several days to resolve.  6. It is common to experience some constipation if taking pain medication after surgery.  Increasing fluid intake and taking a stool softener will usually help or prevent this problem from occurring.  A mild laxative (Milk of Magnesia or Miralax) should be taken according to package directions if there are no bowel movements after 48 hours. 7. Unless discharge instructions indicate otherwise, you may remove your bandages 24-48 hours after surgery, and you may shower at that time.  You may have steri-strips (small skin tapes) in place directly over the incision.  These strips should be left on the skin for 7-10 days.  If your surgeon used skin glue on the incision, you may shower in 24 hours.  The  glue will flake off over the next 2-3 weeks.  Any sutures or staples will be removed at the office during your follow-up visit. 8. ACTIVITIES:  You may resume regular (light) daily activities beginning the next day--such as daily self-care, walking, climbing stairs--gradually increasing activities as tolerated.  You may have sexual intercourse when it is comfortable.  Refrain from any heavy lifting or straining until approved by your doctor. a. You may drive when you no longer are taking prescription pain medication, you can comfortably wear a seatbelt, and you can safely maneuver your car and apply brakes b. RETURN TO WORK:  __________________________________________________________ 9. You should see your doctor in the office for a follow-up appointment approximately two weeks after your surgery.  Make sure that you call for this appointment within a day or two after you arrive home to insure a convenient appointment time. 10. OTHER INSTRUCTIONS: ___TAKE TUMS WITH CALCIUM 2 TABS TWICE DAILY. 11. TAKE CITRICAL CALCIUM SUPPLEMENT 600 MG OVER THE COUNTER DAILY. 12. OK TO SHOWER STARTING TOMORROW_________________________________________________________________________ _________________________________________________________________________________________________________________ _________________________________________________________________________________________________________________   WHEN TO CALL YOUR DOCTOR: 1. Fever over 101.0 2. Inability to urinate 3. Nausea and/or vomiting 4. Extreme swelling or bruising 5. Continued bleeding from incision. 6. Increased pain, redness, or drainage from the incision. 7. Difficulty swallowing or breathing 8. Muscle cramping or spasms. 9. Numbness or tingling in hands or feet or around lips.  The clinic staff is available to answer your questions during regular business hours.  Please dont hesitate to call and ask  to speak to one of the nurses if you  have concerns.  For further questions, please visit [email or web address]

## 2018-07-18 ENCOUNTER — Encounter (HOSPITAL_COMMUNITY): Payer: Self-pay | Admitting: Surgery

## 2018-08-06 ENCOUNTER — Ambulatory Visit: Payer: BLUE CROSS/BLUE SHIELD | Admitting: "Endocrinology

## 2018-08-06 ENCOUNTER — Telehealth: Payer: Self-pay | Admitting: Adult Health

## 2018-08-06 DIAGNOSIS — N6002 Solitary cyst of left breast: Secondary | ICD-10-CM

## 2018-08-06 NOTE — Telephone Encounter (Signed)
Patient called stating that she would like a call back from Jennifer, Patient did not state the reason why. Please contact pt °

## 2018-08-06 NOTE — Telephone Encounter (Signed)
Mammogram and [place] on left breast 10/15 at 10 am at [facility], pt aware

## 2018-08-06 NOTE — Telephone Encounter (Signed)
Will place order for left breast US

## 2018-08-18 ENCOUNTER — Ambulatory Visit: Payer: BLUE CROSS/BLUE SHIELD | Admitting: "Endocrinology

## 2018-08-18 DIAGNOSIS — E210 Primary hyperparathyroidism: Secondary | ICD-10-CM | POA: Diagnosis not present

## 2018-08-19 LAB — PTH, INTACT AND CALCIUM
Calcium: 9.3 mg/dL (ref 8.6–10.4)
PTH: 74 pg/mL — AB (ref 14–64)

## 2018-09-07 ENCOUNTER — Encounter: Payer: Self-pay | Admitting: "Endocrinology

## 2018-09-07 ENCOUNTER — Ambulatory Visit (INDEPENDENT_AMBULATORY_CARE_PROVIDER_SITE_OTHER): Payer: BLUE CROSS/BLUE SHIELD | Admitting: "Endocrinology

## 2018-09-07 VITALS — BP 130/83 | HR 62 | Ht 66.0 in | Wt 149.0 lb

## 2018-09-07 DIAGNOSIS — E210 Primary hyperparathyroidism: Secondary | ICD-10-CM | POA: Diagnosis not present

## 2018-09-07 NOTE — Progress Notes (Signed)
Endocrinology follow-up Note       09/07/2018, 5:32 PM  Nancy Barrett is a 52 y.o.-year-old female, referred by her PCP, Derrek Monaco , NP, for evaluation for hypercalcemia.  She is status post parathyroidectomy of left superior gland for primary hyperparathyroidism.  Past Medical History:  Diagnosis Date  . Anemia   . Bronchitis   . Cervical high risk human papillomavirus (HPV) DNA test positive 03/02/2018  . Fibroids 12/26/2014  . Hot flashes 12/15/2014  . Irregular periods 12/15/2014  . Melanoma ([place])    nose, removed  . Ovarian cyst 12/26/2014  . Peri-menopause 12/15/2014  . Thickened endometrium 03/03/2018   ?polyp, will get endo bx    Past Surgical History:  Procedure Laterality Date  . PARATHYROIDECTOMY N/A 07/17/2018   Procedure: MINIMALLY INVASIVE PARATHYROIDECTOMY ERAS PATHWAY;  Surgeon: Coralie Keens, MD;  Location: [facility];  Service: General;  Laterality: N/A;  . SKIN CANCER EXCISION  2014   removed from nose  . TUBAL LIGATION      Social History   Tobacco Use  . Smoking status: Never Smoker  . Smokeless tobacco: Never Used  Substance Use Topics  . Alcohol use: No  . Drug use: No    Outpatient Encounter Medications as of 09/07/2018  Medication Sig  . Cholecalciferol 5000 units capsule Take 1 capsule (5,000 Units total) by mouth daily.  . [DISCONTINUED] guaiFENesin (MUCINEX) 600 MG 12 hr tablet Take 600 mg by mouth 2 (two) times daily as needed (for sinus issues.).  . [DISCONTINUED] ibuprofen (ADVIL,MOTRIN) 200 MG tablet Take 400 mg by mouth every 8 (eight) hours as needed (for pain.).  . [DISCONTINUED] oxyCODONE (OXY IR/ROXICODONE) 5 MG immediate release tablet Take 1 tablet (5 mg total) by mouth every 6 (six) hours as needed for moderate pain, severe pain or breakthrough pain.   No facility-administered encounter medications on file as of 09/07/2018.     Allergies  Allergen Reactions  . Amoxicillin  Hives    Has patient had a PCN reaction causing immediate rash, facial/tongue/throat swelling, SOB or lightheadedness with hypotension: Unknown Has patient had a PCN reaction causing severe rash involving mucus membranes or skin necrosis: Unknown Has patient had a PCN reaction that required hospitalization: No Has patient had a PCN reaction occurring within the last 10 years: No If all of the above answers are "NO", then may proceed with Cephalosporin use.    Marland Kitchen Penicillins Hives    Has patient had a PCN reaction causing immediate rash, facial/tongue/throat swelling, SOB or lightheadedness with hypotension: Unknown Has patient had a PCN reaction causing severe rash involving mucus membranes or skin necrosis: Unknown Has patient had a PCN reaction that required hospitalization: No Has patient had a PCN reaction occurring within the last 10 years: No If all of the above answers are "NO", then may proceed with Cephalosporin use.      HPI  Nancy Barrett was diagnosed with hypercalcemia at least from 2016.   -She was sent for parathyroidectomy during her last visit.  She underwent removal of left superior gland which revealed parathyroid adenoma, uneventful postsurgical course.  She reports feeling better.  Her repeat labs revealed the  following Lab Results  Component Value Date   PTH 74 (H) 08/18/2018   PTH 121 (H) 03/27/2018   CALCIUM 9.3 08/18/2018   CALCIUM 11.0 (H) 03/27/2018   CALCIUM 11.4 (H) 02/13/2018   CALCIUM 10.6 (H) 10/25/2015   CALCIUM 10.7 (H) 12/15/2014   She has had nephrolithiasis 2 years ago which she passed without intervention.  No prior history of fragility fractures or falls.  Her recent bone density showed osteopenia.  No history of CKD. Last BUN/Cr: Lab Results  Component Value Date   BUN 15 02/13/2018   CREATININE 0.79 02/13/2018    she is not on HCTZ or other thiazide therapy.  She is currently on vitamin D3 supplement.   she is not on calcium  supplements,  she eats dairy and green, leafy, vegetables on average amounts.  she does not have a family history of hypercalcemia, pituitary tumors, thyroid cancer, or osteoporosis.  -She never had a screening bone density.  She is 1 year postmenopausal.   ROS:  Constitutional: + weight gain, no fatigue, no subjective hyperthermia, no subjective hypothermia Eyes: no blurry vision, no xerophthalmia ENT: no sore throat, no nodules palpated in throat, no dysphagia/odynophagia, no hoarseness Cardiovascular: no Chest Pain, no palpitations.   Respiratory: no cough, no SOB Gastrointestinal: no Nausea/Vomiting/Diarhhea Musculoskeletal: no muscle/joint aches Skin: no rashes Neurological: no tremors, no numbness, no tingling, no dizziness Psychiatric: no depression, no anxiety  PE: BP 130/83   Pulse 62   Ht 5\' 6"  (1.676 m)   Wt 149 lb (67.6 kg)   BMI 24.05 kg/m  Wt Readings from Last 3 Encounters:  09/07/18 149 lb (67.6 kg)  07/17/18 143 lb 8 oz (65.1 kg)  07/13/18 143 lb 8 oz (65.1 kg)   Constitutional: + Appropriate weight for height, not in acute distress, normal state of mind Eyes: PERRLA, EOMI, no exophthalmos ENT: moist mucous membranes, no thyromegaly, no cervical lymphadenopathy Musculoskeletal: no gross deformities, strength intact in all four extremities Skin: moist, warm, no rashes Neurological: no tremor with outstretched hands, Deep tendon reflexes normal in all four extremities.  Recent Results (from the past 2160 hour(s))  Pregnancy, urine     Status: None   Collection Time: 07/13/18  1:22 PM  Result Value Ref Range   Preg Test, Ur NEGATIVE NEGATIVE    Comment:        THE SENSITIVITY OF THIS METHODOLOGY IS >20 mIU/mL. Performed at [facility], [place] [address] [place], [place] [address]   CBC     Status: None   Collection Time: 07/13/18  1:43 PM  Result Value Ref Range   WBC 4.4 4.0 - 10.5 K/uL   RBC 4.62 3.87 - 5.11 MIL/uL   Hemoglobin  13.6 12.0 - 15.0 g/dL   HCT 41.5 36.0 - 46.0 %   MCV 89.8 78.0 - 100.0 fL   MCH 29.4 26.0 - 34.0 pg   MCHC 32.8 30.0 - 36.0 g/dL   RDW 12.7 11.5 - 15.5 %   Platelets 172 150 - 400 K/uL    Comment: Performed at [facility], [place] [address], [place], [place] [address]  PTH, intact and calcium     Status: Abnormal   Collection Time: 08/18/18  3:49 PM  Result Value Ref Range   PTH 74 (H) 14 - 64 pg/mL    Comment: . Interpretive Guide    Intact PTH           Calcium ------------------    ----------           -------  Normal Parathyroid    Normal               Normal Hypoparathyroidism    Low or Low Normal    Low Hyperparathyroidism    Primary            Normal or High       High    Secondary          High                 Normal or Low    Tertiary           High                 High Non-Parathyroid    Hypercalcemia      Low or Low Normal    High .    Calcium 9.3 8.6 - 10.4 mg/dL      Diabetic Labs (most recent): Lab Results  Component Value Date   HGBA1C 5.3 02/13/2018     Lipid Panel ( most recent) Lipid Panel     Component Value Date/Time   CHOL 191 02/13/2018 0857   TRIG 37 02/13/2018 0857   HDL 75 02/13/2018 0857   CHOLHDL 2.5 02/13/2018 0857   CHOLHDL 3.2 12/15/2014 1635   VLDL 11 12/15/2014 1635   LDLCALC 109 (H) 02/13/2018 0857      Lab Results  Component Value Date   TSH 2.56 03/27/2018   TSH 3.130 02/13/2018   TSH 2.453 12/15/2014   FREET4 1.0 03/27/2018      Assessment: 1.  Primary hyperparathyroidism with hypercalcemia-status post left superior parathyroidectomy 2.  Osteopenia  Plan: -Her calcium has responded to 9.3 from 11.4.  PTH remains above normal at 74, improving from 121.  Unlikely treatment failure.    She will not need intervention at this time.  She will have a repeat PTH/calcium, thyroid function test in 3 months with office visit.     -He has mild vitamin D insufficiency, she is on vitamin D3 supplement. -She  has dealt with hypercalcemia for at least 3 years with  apparent complication of nephrolithiasis, now osteopenia.    She denies any history of fragility fractures abdominal pain, major mood disorders, bone pain.     - Return in about 3 months (around 12/08/2018) for Follow up with Pre-visit Labs.   Glade Lloyd, MD [facility] [address] [place], [place] [address] Phone: [phone]  Fax: [phone]    This note was partially dictated with voice recognition software. Similar sounding words can be transcribed inadequately or may not  be corrected upon review.  09/07/2018, 5:32 PM

## 2018-09-15 ENCOUNTER — Ambulatory Visit (HOSPITAL_COMMUNITY)
Admission: RE | Admit: 2018-09-15 | Discharge: 2018-09-15 | Disposition: A | Discharge status: Home / Self Care | Payer: BLUE CROSS/BLUE SHIELD | Source: Ambulatory Visit | Attending: Adult Health | Admitting: Adult Health

## 2018-09-15 ENCOUNTER — Ambulatory Visit (HOSPITAL_COMMUNITY): Admission: RE | Admit: 2018-09-15 | Payer: BLUE CROSS/BLUE SHIELD | Source: Ambulatory Visit

## 2018-09-15 DIAGNOSIS — N6002 Solitary cyst of left breast: Secondary | ICD-10-CM | POA: Diagnosis not present

## 2018-09-15 DIAGNOSIS — R921 Mammographic calcification found on diagnostic imaging of breast: Secondary | ICD-10-CM | POA: Diagnosis not present

## 2018-10-23 ENCOUNTER — Telehealth: Payer: Self-pay | Admitting: Adult Health

## 2018-10-23 NOTE — Telephone Encounter (Signed)
Patient called, stated she had a mammogram in October.  She felt a knot in her left breast a couple days ago and it's still there.  She stated it is really hard.  A little discomfort when she lays on that side.  She really wants to speak to [place] about it.  [phone]

## 2018-10-23 NOTE — Telephone Encounter (Signed)
Has knot left breast, tender, has normal mammogram in October , to come Monday to check breat, be here at 8:30

## 2018-10-26 ENCOUNTER — Ambulatory Visit (INDEPENDENT_AMBULATORY_CARE_PROVIDER_SITE_OTHER): Payer: BLUE CROSS/BLUE SHIELD | Admitting: Adult Health

## 2018-10-26 ENCOUNTER — Other Ambulatory Visit: Payer: Self-pay

## 2018-10-26 ENCOUNTER — Encounter: Payer: Self-pay | Admitting: Adult Health

## 2018-10-26 ENCOUNTER — Telehealth: Payer: Self-pay | Admitting: *Deleted

## 2018-10-26 VITALS — BP 145/90 | HR 75 | Ht 67.0 in | Wt 153.0 lb

## 2018-10-26 DIAGNOSIS — Z1211 Encounter for screening for malignant neoplasm of colon: Secondary | ICD-10-CM | POA: Diagnosis not present

## 2018-10-26 DIAGNOSIS — N61 Mastitis without abscess: Secondary | ICD-10-CM

## 2018-10-26 DIAGNOSIS — Z1212 Encounter for screening for malignant neoplasm of rectum: Secondary | ICD-10-CM

## 2018-10-26 DIAGNOSIS — N6320 Unspecified lump in the left breast, unspecified quadrant: Secondary | ICD-10-CM | POA: Diagnosis not present

## 2018-10-26 MED ORDER — SULFAMETHOXAZOLE-TRIMETHOPRIM 800-160 MG PO TABS: 1.0000 | ORAL_TABLET | Freq: Two times a day (BID) | ORAL | 0 refills | Status: DC

## 2018-10-26 MED ORDER — METRONIDAZOLE 500 MG PO TABS: 500.0000 mg | ORAL_TABLET | Freq: Two times a day (BID) | ORAL | 0 refills | Status: DC

## 2018-10-26 MED ORDER — CIPROFLOXACIN HCL 500 MG PO TABS: 500.0000 mg | ORAL_TABLET | Freq: Two times a day (BID) | ORAL | 0 refills | Status: DC

## 2018-10-26 NOTE — Progress Notes (Addendum)
  Subjective:     Patient ID: Nancy Barrett, female   DOB: 12-07-1965, 52 y.o.   MRN: [id number]  HPI Nancy Barrett is a 52 year old white female, in complaining of left breat mass, and it is tender.She had normal left mammogram, in October. And she requests referral for colonoscopy.  Review of Systems +left breast mass and tenderness Reviewed past medical,surgical, social and family history. Reviewed medications and allergies.     Objective:   Physical Exam BP (!) 145/90 (BP Location: Right Arm, Patient Position: Sitting, Cuff Size: Normal)   Pulse 75   Ht 5\' 7"  (1.702 m)   Wt 153 lb (69.4 kg)   BMI 23.96 kg/m    Skin warm and dry,  Breasts:no dominate palpable mass, retraction or nipple discharge on right, on left, no retraction or nipple discharge, has 8 x 5 cm mass, at 0- 2 o'clock, that is tender and mobile, and has some heat to it, no redness, ?cyst with mastitis, will treat with antibiotic and recheck in 1 week, may get [place] if not resolving. .  Had verbal permission to do exam without chaperone.     Assessment:     1. Left breast mass   2. Mastitis   3. Screening for colorectal cancer       Plan:     Meds ordered this encounter  Medications  . sulfamethoxazole-trimethoprim (BACTRIM DS,SEPTRA DS) 800-160 MG tablet    Sig: Take 1 tablet by mouth 2 (two) times daily. Take 1 bid    Dispense:  28 tablet    Refill:  0    Order Specific Question:   Supervising Provider    Answer:   EURE, LUTHER H [[id number]]  . metroNIDAZOLE (FLAGYL) 500 MG tablet    Sig: Take 1 tablet (500 mg total) by mouth 2 (two) times daily.    Dispense:  14 tablet    Refill:  0    Order Specific Question:   Supervising Provider    Answer:   Tania Ade H [[id number]]  Use cold compresses Take advil for pain Wear sports bra Referred to Dr Gala Romney for screening colonoscopy F/U in 1 week, may get [place] if not resolving

## 2018-10-26 NOTE — Telephone Encounter (Signed)
Pt called back and said her mom said she is allergic to sulfa will rx cipro

## 2018-11-02 ENCOUNTER — Ambulatory Visit (INDEPENDENT_AMBULATORY_CARE_PROVIDER_SITE_OTHER): Payer: BLUE CROSS/BLUE SHIELD | Admitting: Adult Health

## 2018-11-02 ENCOUNTER — Encounter: Payer: Self-pay | Admitting: Adult Health

## 2018-11-02 VITALS — BP 138/86 | HR 68 | Ht 67.0 in | Wt 159.0 lb

## 2018-11-02 DIAGNOSIS — N6320 Unspecified lump in the left breast, unspecified quadrant: Secondary | ICD-10-CM | POA: Diagnosis not present

## 2018-11-02 NOTE — Progress Notes (Signed)
  Subjective:     Patient ID: Nancy Barrett, female   DOB: 02-14-1966, 52 y.o.   MRN: [id number]  HPI  Nancy Barrett is a 52 year old white female, back in for breast recheck was treated with Cipro and flagyl for ?mastitis and tender breast mass.She was seen 10/26/18.  Review of Systems Breast feels much better, no tenderness Reviewed past medical,surgical, social and family history. Reviewed medications and allergies.     Objective:   Physical Exam BP 138/86 (BP Location: Left Arm, Patient Position: Sitting, Cuff Size: Normal)   Pulse 68   Ht 5\' 7"  (1.702 m)   Wt 159 lb (72.1 kg)   BMI 24.90 kg/m     Skin warm and dry,  Breasts:no dominate palpable mass, retraction or nipple discharge on right, on left, there is no retraction or nipple discharge and no heat, or tenderness and mass has decreased in size to 4 x 2 cm, will continue antibiotics and recheck in 2 weeks.  Assessment:     Left breast mass    Plan:     Finish antibiotics Recheck in 2 weeks, if not totally resolved will get [place]

## 2018-11-12 ENCOUNTER — Ambulatory Visit: Payer: BLUE CROSS/BLUE SHIELD

## 2018-11-17 ENCOUNTER — Ambulatory Visit (INDEPENDENT_AMBULATORY_CARE_PROVIDER_SITE_OTHER): Payer: Self-pay

## 2018-11-17 DIAGNOSIS — E210 Primary hyperparathyroidism: Secondary | ICD-10-CM | POA: Diagnosis not present

## 2018-11-17 DIAGNOSIS — Z1211 Encounter for screening for malignant neoplasm of colon: Secondary | ICD-10-CM

## 2018-11-17 MED ORDER — NA SULFATE-K SULFATE-MG SULF 17.5-3.13-1.6 GM/177ML PO SOLN: 1.0000 | ORAL | 0 refills | Status: DC

## 2018-11-17 NOTE — Progress Notes (Signed)
Ok to schedule.

## 2018-11-17 NOTE — Patient Instructions (Signed)
Nancy Barrett  11-04-1966 MRN: [id number]     Procedure Date: 02/08/19 Time to register: 8:30am Place to register: [facility] Procedure Time: 9:30am Scheduled provider: Barney Drain, MD    PREPARATION FOR COLONOSCOPY WITH SUPREP BOWEL PREP KIT  Note: Suprep Bowel Prep Kit is a split-dose (2day) regimen. Consumption of BOTH 6-ounce bottles is required for a complete prep.  Please notify us immediately if you are diabetic, take iron supplements, or if you are on Coumadin or any other blood thinners.                                                                                                                                                  1 DAY BEFORE PROCEDURE:  DATE: 02/07/19   DAY: Sunday  clear liquids the entire day - NO SOLID FOOD.   At 6:00pm: Complete steps 1 through 4 below, using ONE (1) 6-ounce bottle, before going to bed. Step 1:  Pour ONE (1) 6-ounce bottle of SUPREP liquid into the mixing container.  Step 2:  Add cool drinking water to the 16 ounce line on the container and mix.  Note: Dilute the solution concentrate as directed prior to use. Step 3:  DRINK ALL the liquid in the container. Step 4:  You MUST drink an additional two (2) or more 16 ounce containers of water over the next one (1) hour.   Continue clear liquids.  DAY OF PROCEDURE:   DATE: 02/08/19   DAY: Monday If you take medications for your heart, blood pressure, or breathing, you may take these medications.   5 hours before your procedure at :4:30am Step 1:  Pour ONE (1) 6-ounce bottle of SUPREP liquid into the mixing container.  Step 2:  Add cool drinking water to the 16 ounce line on the container and mix.  Note: Dilute the solution concentrate as directed prior to use. Step 3:  DRINK ALL the liquid in the container. Step 4:  You MUST drink an additional two (2) or more 16 ounce containers of water over the next one (1) hour. You MUST complete the final glass of water at least 3 hours  before your colonoscopy.   Nothing by mouth past: 6:30am  You may take your morning medications with sip of water unless we have instructed otherwise.    Please see below for Dietary Information.  CLEAR LIQUIDS INCLUDE:  Water Jello (NOT red in color)   Ice Popsicles (NOT red in color)   Tea (sugar ok, no milk/cream) Powdered fruit flavored drinks  Coffee (sugar ok, no milk/cream) Gatorade/ Lemonade/ Kool-Aid  (NOT red in color)   Juice: apple, white grape, white cranberry Soft drinks  Clear bullion, consomme, broth (fat free beef/chicken/vegetable)  Carbonated beverages (any kind)  Strained chicken noodle soup Hard Candy   Remember: Clear liquids are liquids that will  allow you to see your fingers on the other side of a clear glass. Be sure liquids are NOT red in color, and not cloudy, but CLEAR.  DO NOT EAT OR DRINK ANY OF THE FOLLOWING:  Dairy products of any kind   Cranberry juice Tomato juice / V8 juice   Grapefruit juice Orange juice     Red grape juice  Do not eat any solid foods, including such foods as: cereal, oatmeal, yogurt, fruits, vegetables, creamed soups, eggs, bread, crackers, pureed foods in a blender, etc.   HELPFUL HINTS FOR DRINKING PREP SOLUTION:   Make sure prep is extremely cold. Mix and refrigerate the the morning of the prep. You may also put in the freezer.   You may try mixing some Crystal Light or Country Time Lemonade if you prefer. Mix in small amounts; add more if necessary.  Try drinking through a straw  Rinse mouth with water or a mouthwash between glasses, to remove after-taste.  Try sipping on a cold beverage /ice/ popsicles between glasses of prep.  Place a piece of sugar-free hard candy in mouth between glasses.  If you become nauseated, try consuming smaller amounts, or stretch out the time between glasses. Stop for 30-60 minutes, then slowly start back drinking.     OTHER INSTRUCTIONS  You will need a responsible adult at least  53 years of age to accompany you and drive you home. This person must remain in the waiting room during your procedure. The hospital will cancel your procedure if you do not have a responsible adult with you.   1. Wear loose fitting clothing that is easily removed. 2. Leave jewelry and other valuables at home.  3. Remove all body piercing jewelry and leave at home. 4. Total time from sign-in until discharge is approximately 2-3 hours. 5. You should go home directly after your procedure and rest. You can resume normal activities the day after your procedure. 6. The day of your procedure you should not:  Drive  Make legal decisions  Operate machinery  Drink alcohol  Return to work   You may call the office (Dept: [phone]) before 5:00pm, or page the doctor on call [phone]) after 5:00pm, for further instructions, if necessary.   Insurance Information YOU WILL NEED TO CHECK WITH YOUR INSURANCE COMPANY FOR THE BENEFITS OF COVERAGE YOU HAVE FOR THIS PROCEDURE.  UNFORTUNATELY, NOT ALL INSURANCE COMPANIES HAVE BENEFITS TO COVER ALL OR PART OF THESE TYPES OF PROCEDURES.  IT IS YOUR RESPONSIBILITY TO CHECK YOUR BENEFITS, HOWEVER, WE WILL BE GLAD TO ASSIST YOU WITH ANY CODES YOUR INSURANCE COMPANY MAY NEED.    PLEASE NOTE THAT MOST INSURANCE COMPANIES WILL NOT COVER A SCREENING COLONOSCOPY FOR PEOPLE UNDER THE AGE OF 50  IF YOU HAVE BCBS INSURANCE, YOU MAY HAVE BENEFITS FOR A SCREENING COLONOSCOPY BUT IF POLYPS ARE FOUND THE DIAGNOSIS WILL CHANGE AND THEN YOU MAY HAVE A DEDUCTIBLE THAT WILL NEED TO BE MET. SO PLEASE MAKE SURE YOU CHECK YOUR BENEFITS FOR A SCREENING COLONOSCOPY AS WELL AS A DIAGNOSTIC COLONOSCOPY.

## 2018-11-17 NOTE — Progress Notes (Signed)
Gastroenterology Pre-Procedure Review  Request Date:11/17/18 Requesting Physician: Derrek Monaco NP- no previous tcs  PATIENT REVIEW QUESTIONS: The patient responded to the following health history questions as indicated:    1. Diabetes Melitis: no 2. Joint replacements in the past 12 months: no 3. Major health problems in the past 3 months: yes (had thyroid surgery-07/2018) 4. Has an artificial valve or MVP: no 5. Has a defibrillator: no 6. Has been advised in past to take antibiotics in advance of a procedure like teeth cleaning: no 7. Family history of colon cancer: yes (maternal uncle and great grandfather)  25. Alcohol Use: no 9. History of sleep apnea: no  10. History of coronary artery or other vascular stents placed within the last 12 months: no 11. History of any prior anesthesia complications: no    MEDICATIONS & ALLERGIES:    Patient reports the following regarding taking any blood thinners:   Plavix? no Aspirin? no Coumadin? no Brilinta? no Xarelto? no Eliquis? no Pradaxa? no Savaysa? no Effient? no  Patient confirms/reports the following medications:  Current Outpatient Medications  Medication Sig Dispense Refill  . Cholecalciferol 5000 units capsule Take 1 capsule (5,000 Units total) by mouth daily.    . Multiple Vitamins-Minerals (ONE-A-DAY 50 PLUS PO) Take by mouth.     No current facility-administered medications for this visit.     Patient confirms/reports the following allergies:  Allergies  Allergen Reactions  . Sulfa Antibiotics   . Amoxicillin Hives    Has patient had a PCN reaction causing immediate rash, facial/tongue/throat swelling, SOB or lightheadedness with hypotension: Unknown Has patient had a PCN reaction causing severe rash involving mucus membranes or skin necrosis: Unknown Has patient had a PCN reaction that required hospitalization: No Has patient had a PCN reaction occurring within the last 10 years: No If all of the above answers  are "NO", then may proceed with Cephalosporin use.    Marland Kitchen Penicillins Hives    Has patient had a PCN reaction causing immediate rash, facial/tongue/throat swelling, SOB or lightheadedness with hypotension: Unknown Has patient had a PCN reaction causing severe rash involving mucus membranes or skin necrosis: Unknown Has patient had a PCN reaction that required hospitalization: No Has patient had a PCN reaction occurring within the last 10 years: No If all of the above answers are "NO", then may proceed with Cephalosporin use.     No orders of the defined types were placed in this encounter.   AUTHORIZATION INFORMATION Primary Insurance: [place],  [place] #: [id number] Pre-Cert / Josem Kaufmann required: no   SCHEDULE INFORMATION: Procedure has been scheduled as follows:  Date: 02/08/19, Time: 9:30 Location: [facility] [address]  This Gastroenterology Pre-Precedure Review Form is being routed to the following provider(s): Neil Crouch, PA

## 2018-11-18 LAB — PTH, INTACT AND CALCIUM
CALCIUM: 9.8 mg/dL (ref 8.6–10.4)
PTH: 26 pg/mL (ref 14–64)

## 2018-11-18 LAB — TSH: TSH: 2.22 mIU/L

## 2018-11-18 LAB — VITAMIN D 25 HYDROXY (VIT D DEFICIENCY, FRACTURES): Vit D, 25-Hydroxy: 41 ng/mL (ref 30–100)

## 2018-11-18 LAB — T4, FREE: FREE T4: 1 ng/dL (ref 0.8–1.8)

## 2018-11-19 ENCOUNTER — Ambulatory Visit (INDEPENDENT_AMBULATORY_CARE_PROVIDER_SITE_OTHER): Payer: BLUE CROSS/BLUE SHIELD | Admitting: Adult Health

## 2018-11-19 ENCOUNTER — Encounter: Payer: Self-pay | Admitting: Adult Health

## 2018-11-19 VITALS — BP 129/78 | HR 69 | Ht 67.0 in | Wt 157.0 lb

## 2018-11-19 DIAGNOSIS — N6320 Unspecified lump in the left breast, unspecified quadrant: Secondary | ICD-10-CM

## 2018-11-19 NOTE — Progress Notes (Signed)
Patient ID: Nancy Barrett, female   DOB: 02/21/1966, 52 y.o.   MRN: [id number] History of Present Illness: Nancy Barrett is a 52 year old white female, back for breast exam and mass still felt.Has finished antibiotics.  Current Medications, Allergies, Past Medical History, Past Surgical History, Family History and Social History were reviewed in [employer] record.    Review of Systems: Left breast mass still felt  Physical Exam:BP 129/78 (BP Location: Left Arm, Patient Position: Sitting, Cuff Size: Normal)   Pulse 69   Ht 5\' 7"  (1.702 m)   Wt 157 lb (71.2 kg)   BMI 24.59 kg/m  General:  Well developed, well nourished, no acute distress Skin:  Warm and dry Breast:  No dominant palpable mass, retraction, or nipple discharge on right, on left no retraction on nipple discharge, but still has firm, mobile mass about 2 o'clock position, it is about 3 x 2 cm now which is smaller and she has finished antibiotics.  Psych:  No mood changes, alert and cooperative,seems happy Will get diagnotic mammogram and left breast US to evaluate.   Impression: 1. Left breast mass       Plan: Left diagnostic mammogram and left breast US scheduled 12/24 at 9 am at [facility] F/U prn

## 2018-11-24 ENCOUNTER — Ambulatory Visit (HOSPITAL_COMMUNITY)
Admission: RE | Admit: 2018-11-24 | Discharge: 2018-11-24 | Disposition: A | Discharge status: Home / Self Care | Payer: BLUE CROSS/BLUE SHIELD | Source: Ambulatory Visit | Attending: Adult Health | Admitting: Adult Health

## 2018-11-24 DIAGNOSIS — R922 Inconclusive mammogram: Secondary | ICD-10-CM | POA: Diagnosis not present

## 2018-11-24 DIAGNOSIS — N6320 Unspecified lump in the left breast, unspecified quadrant: Secondary | ICD-10-CM | POA: Diagnosis not present

## 2018-11-24 DIAGNOSIS — N6002 Solitary cyst of left breast: Secondary | ICD-10-CM | POA: Diagnosis not present

## 2018-12-08 ENCOUNTER — Ambulatory Visit (INDEPENDENT_AMBULATORY_CARE_PROVIDER_SITE_OTHER): Payer: BLUE CROSS/BLUE SHIELD | Admitting: "Endocrinology

## 2018-12-08 ENCOUNTER — Encounter: Payer: Self-pay | Admitting: "Endocrinology

## 2018-12-08 VITALS — BP 127/81 | HR 77 | Ht 67.0 in | Wt 156.0 lb

## 2018-12-08 DIAGNOSIS — E210 Primary hyperparathyroidism: Secondary | ICD-10-CM

## 2018-12-08 NOTE — Progress Notes (Signed)
Endocrinology follow-up Note       12/08/2018, 4:00 PM  Nancy Barrett is a 53 y.o.-year-old female, referred by her PCP, Derrek Monaco , NP, for evaluation for hypercalcemia.  She is status post parathyroidectomy of left superior gland for primary hyperparathyroidism.  Past Medical History:  Diagnosis Date  . Anemia   . Bronchitis   . Cervical high risk human papillomavirus (HPV) DNA test positive 03/02/2018  . Fibroids 12/26/2014  . Hot flashes 12/15/2014  . Irregular periods 12/15/2014  . Melanoma ([place])    nose, removed  . Ovarian cyst 12/26/2014  . Peri-menopause 12/15/2014  . Thickened endometrium 03/03/2018   ?polyp, will get endo bx    Past Surgical History:  Procedure Laterality Date  . PARATHYROIDECTOMY N/A 07/17/2018   Procedure: MINIMALLY INVASIVE PARATHYROIDECTOMY ERAS PATHWAY;  Surgeon: Coralie Keens, MD;  Location: [facility];  Service: General;  Laterality: N/A;  . SKIN CANCER EXCISION  2014   removed from nose  . TUBAL LIGATION      Social History   Tobacco Use  . Smoking status: Never Smoker  . Smokeless tobacco: Never Used  Substance Use Topics  . Alcohol use: No  . Drug use: No    Outpatient Encounter Medications as of 12/08/2018  Medication Sig  . Cholecalciferol 5000 units capsule Take 1 capsule (5,000 Units total) by mouth daily.  . Multiple Vitamins-Minerals (ONE-A-DAY 50 PLUS PO) Take by mouth.  . Na Sulfate-K Sulfate-Mg Sulf (SUPREP BOWEL PREP KIT) 17.5-3.13-1.6 GM/177ML SOLN Take 1 kit by mouth as directed.   No facility-administered encounter medications on file as of 12/08/2018.     Allergies  Allergen Reactions  . Sulfa Antibiotics   . Amoxicillin Hives    Has patient had a PCN reaction causing immediate rash, facial/tongue/throat swelling, SOB or lightheadedness with hypotension: Unknown Has patient had a PCN reaction causing severe rash involving mucus membranes or skin necrosis:  Unknown Has patient had a PCN reaction that required hospitalization: No Has patient had a PCN reaction occurring within the last 10 years: No If all of the above answers are "NO", then may proceed with Cephalosporin use.    Marland Kitchen Penicillins Hives    Has patient had a PCN reaction causing immediate rash, facial/tongue/throat swelling, SOB or lightheadedness with hypotension: Unknown Has patient had a PCN reaction causing severe rash involving mucus membranes or skin necrosis: Unknown Has patient had a PCN reaction that required hospitalization: No Has patient had a PCN reaction occurring within the last 10 years: No If all of the above answers are "NO", then may proceed with Cephalosporin use.      HPI  Nancy Barrett was diagnosed with hypercalcemia at least from 2016.  She underwent parathyroidectomy on July 17, 2018.  She reports feeling better.  Her repeat labs revealed the following improved parameters of parathyroid function. Lab Results  Component Value Date   PTH 26 11/17/2018   PTH 74 (H) 08/18/2018   PTH 121 (H) 03/27/2018   CALCIUM 9.8 11/17/2018   CALCIUM 9.3 08/18/2018   CALCIUM 11.0 (H) 03/27/2018   CALCIUM 11.4 (H) 02/13/2018   CALCIUM 10.6 (H) 10/25/2015   CALCIUM  10.7 (H) 12/15/2014   She has had nephrolithiasis 2 years ago which she passed without intervention.  No prior history of fragility fractures or falls.  Her recent bone density showed osteopenia in April 2019.  No history of CKD. Last BUN/Cr: Lab Results  Component Value Date   BUN 15 02/13/2018   CREATININE 0.79 02/13/2018    she is not on HCTZ or other thiazide therapy.  She is currently on vitamin D3 supplement.   she is not on calcium supplements,  she eats dairy and green, leafy, vegetables on average amounts.  she does not have a family history of hypercalcemia, pituitary tumors, thyroid cancer, or osteoporosis.  -She never had a screening bone density.  She is 1 year  postmenopausal.   ROS:  Constitutional: + steady weight , no fatigue, no subjective hyperthermia, no subjective hypothermia Eyes: no blurry vision, no xerophthalmia ENT: no sore throat, no nodules palpated in throat, no dysphagia/odynophagia, no hoarseness Musculoskeletal: no muscle/joint aches Skin: no rashes Neurological: no tremors, no numbness, no tingling, no dizziness Psychiatric: no depression, no anxiety  PE: BP 127/81   Pulse 77   Ht _0  (1.702 m)   Wt 156 lb (70.8 kg)   BMI 24.43 kg/m  Wt Readings from Last 3 Encounters:  12/08/18 156 lb (70.8 kg)  11/19/18 157 lb (71.2 kg)  11/02/18 159 lb (72.1 kg)   Constitutional: + Appropriate weight for height, not in acute distress, normal state of mind Eyes: PERRLA, EOMI, no exophthalmos ENT: moist mucous membranes, no thyromegaly, no cervical lymphadenopathy Musculoskeletal: no gross deformities, strength intact in all four extremities Skin: moist, warm, no rashes Neurological: no tremor with outstretched hands, Deep tendon reflexes normal in all four extremities.  Recent Results (from the past 2160 hour(s))  PTH, intact and calcium     Status: None   Collection Time: 11/17/18  9:48 AM  Result Value Ref Range   PTH 26 14 - 64 pg/mL    Comment: . Interpretive Guide    Intact PTH           Calcium ------------------    ----------           ------- Normal Parathyroid    Normal               Normal Hypoparathyroidism    Low or Low Normal    Low Hyperparathyroidism    Primary            Normal or High       High    Secondary          High                 Normal or Low    Tertiary           High                 High Non-Parathyroid    Hypercalcemia      Low or Low Normal    High .    Calcium 9.8 8.6 - 10.4 mg/dL  T4, free     Status: None   Collection Time: 11/17/18  9:48 AM  Result Value Ref Range   Free T4 1.0 0.8 - 1.8 ng/dL  TSH     Status: None   Collection Time: 11/17/18  9:48 AM  Result Value Ref Range    TSH 2.22 mIU/L    Comment:  Reference Range .           > or = 20 Years  0.40-4.50 .                Pregnancy Ranges           First trimester    0.26-2.66           Second trimester   0.55-2.73           Third trimester    0.43-2.91   VITAMIN D 25 Hydroxy (Vit-D Deficiency, Fractures)     Status: None   Collection Time: 11/17/18  9:48 AM  Result Value Ref Range   Vit D, 25-Hydroxy 41 30 - 100 ng/mL    Comment: Vitamin D Status         25-OH Vitamin D: . Deficiency:                    <20 ng/mL Insufficiency:             20 - 29 ng/mL Optimal:                 > or = 30 ng/mL . For 25-OH Vitamin D testing on patients on  D2-supplementation and patients for whom quantitation  of D2 and D3 fractions is required, the QuestAssureD(TM) 25-OH VIT D, (D2,D3), LC/MS/MS is recommended: order  code [id number] (patients >22yr). . For more information on this test, go to: http://education.questdiagnostics.com/faq/FAQ163 (This link is being provided for  informational/educational purposes only.)       Diabetic Labs (most recent): Lab Results  Component Value Date   HGBA1C 5.3 02/13/2018     Lipid Panel ( most recent) Lipid Panel     Component Value Date/Time   CHOL 191 02/13/2018 0857   TRIG 37 02/13/2018 0857   HDL 75 02/13/2018 0857   CHOLHDL 2.5 02/13/2018 0857   CHOLHDL 3.2 12/15/2014 1635   VLDL 11 12/15/2014 1635   LDLCALC 109 (H) 02/13/2018 0857      Lab Results  Component Value Date   TSH 2.22 11/17/2018   TSH 2.56 03/27/2018   TSH 3.130 02/13/2018   TSH 2.453 12/15/2014   FREET4 1.0 11/17/2018   FREET4 1.0 03/27/2018      Assessment: 1.  Primary hyperparathyroidism with hypercalcemia-status post left superior parathyroidectomy 2.  Osteopenia  Plan: -Her calcium has responded to 9.8 from 11.4.  PTH proved to 26, overall improving from 121.  He is status post parathyroidectomy in August 2019.  This is consistent with treatment success.   She will  have a repeat PTH/calcium, thyroid function test in 12 months with office visit.  She will need repeat bone density in April 2021.  -She is vitamin D replete, advised to finish her current supply of vitamin D3 5000 units daily and maintain with 2000 units daily.   She denies any history of fragility fractures abdominal pain, major mood disorders, bone pain.     - Return in about 1 year (around 12/09/2019) for Follow up with Pre-visit Labs.   GGlade Lloyd MD [facility]RSpencer [place] [id number]: [phone] Fax: [phone]   This note was partially dictated with voice recognition software. Similar sounding words can be transcribed inadequately or may not  be corrected upon review.  12/08/2018, 4:00 PM

## 2019-02-08 ENCOUNTER — Encounter (HOSPITAL_COMMUNITY): Payer: Self-pay | Admitting: *Deleted

## 2019-02-08 ENCOUNTER — Encounter (HOSPITAL_COMMUNITY)
Admission: RE | Disposition: A | Discharge status: Home / Self Care | Payer: Self-pay | Source: Home / Self Care | Attending: Gastroenterology

## 2019-02-08 ENCOUNTER — Ambulatory Visit (HOSPITAL_COMMUNITY)
Admission: RE | Admit: 2019-02-08 | Discharge: 2019-02-08 | Disposition: A | Discharge status: Home / Self Care | Payer: BLUE CROSS/BLUE SHIELD | Attending: Gastroenterology | Admitting: Gastroenterology

## 2019-02-08 ENCOUNTER — Other Ambulatory Visit: Payer: Self-pay

## 2019-02-08 DIAGNOSIS — Z8249 Family history of ischemic heart disease and other diseases of the circulatory system: Secondary | ICD-10-CM | POA: Diagnosis not present

## 2019-02-08 DIAGNOSIS — Z85820 Personal history of malignant melanoma of skin: Secondary | ICD-10-CM | POA: Insufficient documentation

## 2019-02-08 DIAGNOSIS — D125 Benign neoplasm of sigmoid colon: Secondary | ICD-10-CM

## 2019-02-08 DIAGNOSIS — D123 Benign neoplasm of transverse colon: Secondary | ICD-10-CM | POA: Diagnosis not present

## 2019-02-08 DIAGNOSIS — Q438 Other specified congenital malformations of intestine: Secondary | ICD-10-CM | POA: Diagnosis not present

## 2019-02-08 DIAGNOSIS — K648 Other hemorrhoids: Secondary | ICD-10-CM | POA: Insufficient documentation

## 2019-02-08 DIAGNOSIS — Z1211 Encounter for screening for malignant neoplasm of colon: Secondary | ICD-10-CM

## 2019-02-08 DIAGNOSIS — J45909 Unspecified asthma, uncomplicated: Secondary | ICD-10-CM | POA: Insufficient documentation

## 2019-02-08 HISTORY — PX: COLONOSCOPY: SHX5424

## 2019-02-08 HISTORY — PX: POLYPECTOMY: SHX5525

## 2019-02-08 HISTORY — DX: Headache, unspecified: R51.9

## 2019-02-08 HISTORY — DX: Anesthesia of skin: R20.0

## 2019-02-08 HISTORY — DX: Unspecified asthma, uncomplicated: J45.909

## 2019-02-08 HISTORY — DX: Headache: R51

## 2019-02-08 SURGERY — COLONOSCOPY
Anesthesia: Moderate Sedation

## 2019-02-08 MED ORDER — STERILE WATER FOR IRRIGATION IR SOLN
Status: DC | PRN
  Administered 2019-02-08: 1.5 mL

## 2019-02-08 MED ORDER — MIDAZOLAM HCL 5 MG/5ML IJ SOLN
INTRAMUSCULAR | Status: DC | PRN
  Administered 2019-02-08 (×2): 2 mg via INTRAVENOUS

## 2019-02-08 MED ORDER — MEPERIDINE HCL 100 MG/ML IJ SOLN
INTRAMUSCULAR | Status: DC | PRN
  Administered 2019-02-08 (×2): 25 mg via INTRAVENOUS

## 2019-02-08 MED ORDER — SODIUM CHLORIDE 0.9 % IV SOLN
INTRAVENOUS | Status: DC
  Administered 2019-02-08: 09:00:00 via INTRAVENOUS

## 2019-02-08 MED ORDER — MIDAZOLAM HCL 5 MG/5ML IJ SOLN
INTRAMUSCULAR | Status: AC
  Filled 2019-02-08: qty 10

## 2019-02-08 MED ORDER — MEPERIDINE HCL 100 MG/ML IJ SOLN
INTRAMUSCULAR | Status: AC
  Filled 2019-02-08: qty 2

## 2019-02-08 NOTE — H&P (Signed)
Primary Care Physician:  Patient, No Pcp Per Primary Gastroenterologist:  Dr. Oneida Alar  Pre-Procedure History & Physical: HPI:  Nancy Barrett is a 53 y.o. female here for COLON CANCER SCREENING.  Past Medical History:  Diagnosis Date  . Anemia   . Asthma   . Bronchitis   . Cervical high risk human papillomavirus (HPV) DNA test positive 03/02/2018  . Fibroids 12/26/2014  . Headache   . Hot flashes 12/15/2014  . Irregular periods 12/15/2014  . Melanoma ([place])    nose, removed  . Numbness in both hands   . Ovarian cyst 12/26/2014  . Peri-menopause 12/15/2014  . Thickened endometrium 03/03/2018   ?polyp, will get endo bx    Past Surgical History:  Procedure Laterality Date  . PARATHYROIDECTOMY N/A 07/17/2018   Procedure: MINIMALLY INVASIVE PARATHYROIDECTOMY ERAS PATHWAY;  Surgeon: Coralie Keens, MD;  Location: [facility];  Service: General;  Laterality: N/A;  . SKIN CANCER EXCISION  2014   removed from nose  . TUBAL LIGATION      Prior to Admission medications   Medication Sig Start Date End Date Taking? Authorizing Provider  Cholecalciferol (VITAMIN D-3) 125 MCG (5000 UT) TABS Take 5,000 Units by mouth daily.   Yes [provider]  ferrous sulfate 325 (65 FE) MG tablet Take 325 mg by mouth every other day.   Yes [provider]  Multiple Vitamin (MULTIVITAMIN WITH MINERALS) TABS tablet Take 1 tablet by mouth daily.   Yes [provider]  Na Sulfate-K Sulfate-Mg Sulf (SUPREP BOWEL PREP KIT) 17.5-3.13-1.6 GM/177ML SOLN Take 1 kit by mouth as directed. 11/17/18  Yes Mahala Menghini, PA-C  acetaminophen (TYLENOL) 500 MG tablet Take 1,000 mg by mouth every 6 (six) hours as needed (for pain.).    [provider]    Allergies as of 11/17/2018 - Review Complete 11/17/2018  Allergen Reaction Noted  . Sulfa antibiotics  10/26/2018  . Amoxicillin Hives 03/31/2015  . Penicillins Hives 03/31/2015    Family History  Problem Relation Age of Onset  .  Bronchitis Mother   . Arthritis Mother   . Hypertension Mother   . Hyperlipidemia Mother   . Heart attack Father   . Heart attack Paternal Uncle   . Other Maternal Grandfather        stomach problems  . Cancer Maternal Grandfather        colon cancer  . Heart attack Paternal Grandmother   . Stroke Paternal Grandfather   . Hypertension Brother   . Hypertension Brother   . Hypertension Brother   . Cancer Maternal Uncle        colon cancer  . Colon cancer Maternal Uncle     Social History   Socioeconomic History  . Marital status: Married    Spouse name: Not on file  . Number of children: Not on file  . Years of education: Not on file  . Highest education level: Not on file  Occupational History  . Not on file  Social Needs  . Financial resource strain: Not on file  . Food insecurity:    Worry: Not on file    Inability: Not on file  . Transportation needs:    Medical: Not on file    Non-medical: Not on file  Tobacco Use  . Smoking status: Never Smoker  . Smokeless tobacco: Never Used  Substance and Sexual Activity  . Alcohol use: No  . Drug use: No  . Sexual activity: Yes    Birth control/protection:  Surgical    Comment: tubal  Lifestyle  . Physical activity:    Days per week: Not on file    Minutes per session: Not on file  . Stress: Not on file  Relationships  . Social connections:    Talks on phone: Not on file    Gets together: Not on file    Attends religious service: Not on file    Active member of club or organization: Not on file    Attends meetings of clubs or organizations: Not on file    Relationship status: Not on file  . Intimate partner violence:    Fear of current or ex partner: Not on file    Emotionally abused: Not on file    Physically abused: Not on file    Forced sexual activity: Not on file  Other Topics Concern  . Not on file  Social History Narrative  . Not on file    Review of Systems: See HPI, otherwise negative  ROS   Physical Exam: BP (!) 145/82   Pulse 69   Temp 97.7 F (36.5 C) (Oral)   Resp 14   Ht _0  (1.651 m)   Wt 68 kg   LMP 05/11/2018 (Exact Date)   SpO2 100%   BMI 24.96 kg/m  General:   Alert,  pleasant and cooperative in NAD Head:  Normocephalic and atraumatic. Neck:  Supple; Lungs:  Clear throughout to auscultation.    Heart:  Regular rate and rhythm. Abdomen:  Soft, nontender and nondistended. Normal bowel sounds, without guarding, and without rebound.   Neurologic:  Alert and  oriented x4;  grossly normal neurologically.  Impression/Plan:    SCREENING  Plan:  1. TCS TODAY DISCUSSED PROCEDURE, BENEFITS, & RISKS: < 1% chance of medication reaction, bleeding, perforation, or rupture of spleen/liver.

## 2019-02-08 NOTE — Op Note (Signed)
[facility] Patient Name: Nancy Barrett Procedure Date: 02/08/2019 9:30 AM MRN: [id number] Date of Birth: 07-26-1966 Attending MD: Barney Drain MD, MD CSN: [id number] Age: 53 Admit Type: Outpatient Procedure:                Colonoscopy WITH COLD SNARE POLYPECTOMY Indications:              Screening for colorectal malignant neoplasm Providers:                Barney Drain MD, MD, Charlsie Quest. Theda Sers RN, RN,                            Aram Candela Referring MD:              Medicines:                Meperidine 50 mg IV, Midazolam 5 mg IV Complications:            No immediate complications. Estimated Blood Loss:     Estimated blood loss was minimal. Procedure:                Pre-Anesthesia Assessment:                           - Prior to the procedure, a History and Physical                            was performed, and patient medications and                            allergies were reviewed. The patient's tolerance of                            previous anesthesia was also reviewed. The risks                            and benefits of the procedure and the sedation                            options and risks were discussed with the patient.                            All questions were answered, and informed consent                            was obtained. Prior Anticoagulants: The patient has                            taken no previous anticoagulant or antiplatelet                            agents. ASA Grade Assessment: I - A normal, healthy                            patient. After reviewing the risks and benefits,  the patient was deemed in satisfactory condition to                            undergo the procedure. After obtaining informed                            consent, the colonoscope was passed under direct                            vision. Throughout the procedure, the patient's                            blood pressure, pulse, and oxygen  saturations were                            monitored continuously. The [id number]-[id number] ([id number])                            scope was introduced through the anus and advanced                            to the the cecum, identified by appendiceal orifice                            and ileocecal valve. The colonoscopy was somewhat                            difficult due to a tortuous colon. Successful                            completion of the procedure was aided by                            straightening and shortening the scope to obtain                            bowel loop reduction and COLOWRAP. The patient                            tolerated the procedure well. The quality of the                            bowel preparation was excellent. The ileocecal                            valve, appendiceal orifice, and rectum were                            photographed. Scope In: 10:05:12 AM Scope Out: 10:19:43 AM Scope Withdrawal Time: 0 hours 10 minutes 46 seconds  Total Procedure Duration: 0 hours 14 minutes 31 seconds  Findings:      Three sessile polyps were found in the sigmoid colon, distal transverse       colon and hepatic flexure. The  polyps were 4 to 6 mm in size. These       polyps were removed with a cold snare. Resection and retrieval were       complete.      The recto-sigmoid colon and sigmoid colon were mildly tortuous.      Internal hemorrhoids were found. The hemorrhoids were small. Impression:               - Three 4 to 6 mm polyps in the sigmoid colon, in                            the distal transverse colon and at the hepatic                            flexure, removed with a cold snare. Resected and                            retrieved.                           - Tortuous colon.                           - Internal hemorrhoids. Moderate Sedation:      Moderate (conscious) sedation was administered by the endoscopy nurse       and supervised by the endoscopist.  The following parameters were       monitored: oxygen saturation, heart rate, blood pressure, and response       to care. Total physician intraservice time was 27 minutes. Recommendation:           - Patient has a contact number available for                            emergencies. The signs and symptoms of potential                            delayed complications were discussed with the                            patient. Return to normal activities tomorrow.                            Written discharge instructions were provided to the                            patient.                           - High fiber diet.                           - Continue present medications.                           - Await pathology results.                           -  Repeat colonoscopy in 3 years for surveillance. Procedure Code(s):        --- Professional ---                           [phone], Colonoscopy, flexible; with removal of                            tumor(s), polyp(s), or other lesion(s) by snare                            technique                           99153, Moderate sedation; each additional 15                            minutes intraservice time                           G0500, Moderate sedation services provided by the                            same physician or other qualified health care                            professional performing a gastrointestinal                            endoscopic service that sedation supports,                            requiring the presence of an independent trained                            observer to assist in the monitoring of the                            patient's level of consciousness and physiological                            status; initial 15 minutes of intra-service time;                            patient age 36 years or older (additional time may                            be reported with [phone], as appropriate) Diagnosis  Code(s):        --- Professional ---                           Z12.11, Encounter for screening for malignant                            neoplasm of colon  D12.5, Benign neoplasm of sigmoid colon                           D12.3, Benign neoplasm of transverse colon (hepatic                            flexure or splenic flexure)                           K64.8, Other hemorrhoids                           Q43.8, Other specified congenital malformations of                            intestine CPT copyright 2018 American Medical Association. All rights reserved. The codes documented in this report are preliminary and upon coder review may  be revised to meet current compliance requirements. Barney Drain, MD Barney Drain MD, MD 02/08/2019 10:39:35 AM This report has been signed electronically. Number of Addenda: 0

## 2019-02-08 NOTE — Discharge Instructions (Signed)
You had 3 polyps removed. You have SMALL internal hemorrhoids.   DRINK WATER TO KEEP YOUR URINE LIGHT YELLOW.  FOLLOW A HIGH FIBER DIET. AVOID ITEMS THAT CAUSE BLOATING & GAS. SEE INFO BELOW.  YOUR BIOPSY RESULTS WILL BE BACK IN 5 BUSINESS DAYS.  Next colonoscopy in 3 years.    Colonoscopy Care After Read the instructions outlined below and refer to this sheet in the next week. These discharge instructions provide you with general information on caring for yourself after you leave the hospital. While your treatment has been planned according to the most current medical practices available, unavoidable complications occasionally occur. If you have any problems or questions after discharge, call DR. Ursula Dermody, [phone].  ACTIVITY  You may resume your regular activity, but move at a slower pace for the next 24 hours.   Take frequent rest periods for the next 24 hours.   Walking will help get rid of the air and reduce the bloated feeling in your belly (abdomen).   No driving for 24 hours (because of the medicine (anesthesia) used during the test).   You may shower.   Do not sign any important legal documents or operate any machinery for 24 hours (because of the anesthesia used during the test).    NUTRITION  Drink plenty of fluids.   You may resume your normal diet as instructed by your doctor.   Begin with a light meal and progress to your normal diet. Heavy or fried foods are harder to digest and may make you feel sick to your stomach (nauseated).   Avoid alcoholic beverages for 24 hours or as instructed.    MEDICATIONS  You may resume your normal medications.   WHAT YOU CAN EXPECT TODAY  Some feelings of bloating in the abdomen.   Passage of more gas than usual.   Spotting of blood in your stool or on the toilet paper  .  IF YOU HAD POLYPS REMOVED DURING THE COLONOSCOPY:  Eat a soft diet IF YOU HAVE NAUSEA, BLOATING, ABDOMINAL PAIN, OR VOMITING.    FINDING  OUT THE RESULTS OF YOUR TEST Not all test results are available during your visit. DR. Oneida Alar WILL CALL YOU WITHIN 14 DAYS OF YOUR PROCEDUE WITH YOUR RESULTS. Do not assume everything is normal if you have not heard from DR. Mekaylah Klich, CALL HER OFFICE AT [phone].  SEEK IMMEDIATE MEDICAL ATTENTION AND CALL THE OFFICE: [phone] IF:  You have more than a spotting of blood in your stool.   Your belly is swollen (abdominal distention).   You are nauseated or vomiting.   You have a temperature over 101F.   You have abdominal pain or discomfort that is severe or gets worse throughout the day.   High-Fiber Diet A high-fiber diet changes your normal diet to include more whole grains, legumes, fruits, and vegetables. Changes in the diet involve replacing refined carbohydrates with unrefined foods. The calorie level of the diet is essentially unchanged. The Dietary Reference Intake (recommended amount) for adult males is 38 grams per day. For adult females, it is 25 grams per day. Pregnant and lactating women should consume 28 grams of fiber per day. Fiber is the intact part of a plant that is not broken down during digestion. Functional fiber is fiber that has been isolated from the plant to provide a beneficial effect in the body.  PURPOSE  Increase stool bulk.   Ease and regulate bowel movements.   Lower cholesterol.   REDUCE RISK OF COLON  CANCER  INDICATIONS THAT YOU NEED MORE FIBER  Constipation and hemorrhoids.   Uncomplicated diverticulosis (intestine condition) and irritable bowel syndrome.   Weight management.   As a protective measure against hardening of the arteries (atherosclerosis), diabetes, and cancer.   GUIDELINES FOR INCREASING FIBER IN THE DIET  Start adding fiber to the diet slowly. A gradual increase of about 5 more grams (2 slices of whole-wheat bread, 2 servings of most fruits or vegetables, or 1 bowl of high-fiber cereal) per day is best. Too rapid an  increase in fiber may result in constipation, flatulence, and bloating.   Drink enough water and fluids to keep your urine clear or pale yellow. Water, juice, or caffeine-free drinks are recommended. Not drinking enough fluid may cause constipation.   Eat a variety of high-fiber foods rather than one type of fiber.   Try to increase your intake of fiber through using high-fiber foods rather than fiber pills or supplements that contain small amounts of fiber.   The goal is to change the types of food eaten. Do not supplement your present diet with high-fiber foods, but replace foods in your present diet.   INCLUDE A VARIETY OF FIBER SOURCES  Replace refined and processed grains with whole grains, canned fruits with fresh fruits, and incorporate other fiber sources. White rice, white breads, and most bakery goods contain little or no fiber.   Brown whole-grain rice, buckwheat oats, and many fruits and vegetables are all good sources of fiber. These include: broccoli, Brussels sprouts, cabbage, cauliflower, beets, sweet potatoes, white potatoes (skin on), carrots, tomatoes, eggplant, squash, berries, fresh fruits, and dried fruits.   Cereals appear to be the richest source of fiber. Cereal fiber is found in whole grains and bran. Bran is the fiber-rich outer coat of cereal grain, which is largely removed in refining. In whole-grain cereals, the bran remains. In breakfast cereals, the largest amount of fiber is found in those with "bran" in their names. The fiber content is sometimes indicated on the label.   You may need to include additional fruits and vegetables each day.   In baking, for 1 cup white flour, you may use the following substitutions:   1 cup whole-wheat flour minus 2 tablespoons.   1/2 cup white flour plus 1/2 cup whole-wheat flour.   Polyps, Colon  A polyp is extra tissue that grows inside your body. Colon polyps grow in the large intestine. The large intestine, also called  the colon, is part of your digestive system. It is a long, hollow tube at the end of your digestive tract where your body makes and stores stool. Most polyps are not dangerous. They are benign. This means they are not cancerous. But over time, some types of polyps can turn into cancer. Polyps that are smaller than a pea are usually not harmful. But larger polyps could someday become or may already be cancerous. To be safe, doctors remove all polyps and test them.   WHO GETS POLYPS? Anyone can get polyps, but certain people are more likely than others. You may have a greater chance of getting polyps if:  You are over 50.   You have had polyps before.   Someone in your family has had polyps.   Someone in your family has had cancer of the large intestine.   Find out if someone in your family has had polyps. You may also be more likely to get polyps if you:   Eat a lot of fatty foods  Smoke   Drink alcohol   Do not exercise  Eat too much   PREVENTION There is not one sure way to prevent polyps. You might be able to lower your risk of getting them if you:  Eat more fruits and vegetables and less fatty food.   Do not smoke.   Avoid alcohol.   Exercise every day.   Lose weight if you are overweight.   Eating more calcium and folate can also lower your risk of getting polyps. Some foods that are rich in calcium are milk, cheese, and broccoli. Some foods that are rich in folate are chickpeas, kidney beans, and spinach.

## 2019-02-10 NOTE — Progress Notes (Signed)
Pt is aware.  

## 2019-02-10 NOTE — Progress Notes (Signed)
LMOM to call.

## 2019-02-10 NOTE — Progress Notes (Signed)
ON RECALL  °

## 2019-02-11 ENCOUNTER — Encounter (HOSPITAL_COMMUNITY): Payer: Self-pay | Admitting: Gastroenterology

## 2019-08-27 ENCOUNTER — Other Ambulatory Visit: Payer: Self-pay

## 2019-08-27 ENCOUNTER — Ambulatory Visit (INDEPENDENT_AMBULATORY_CARE_PROVIDER_SITE_OTHER): Payer: BC Managed Care – PPO

## 2019-08-27 ENCOUNTER — Ambulatory Visit
Admission: EM | Admit: 2019-08-27 | Discharge: 2019-08-27 | Disposition: A | Discharge status: Home / Self Care | Payer: BC Managed Care – PPO | Attending: Emergency Medicine | Admitting: Emergency Medicine

## 2019-08-27 DIAGNOSIS — M7731 Calcaneal spur, right foot: Secondary | ICD-10-CM

## 2019-08-27 DIAGNOSIS — M79671 Pain in right foot: Secondary | ICD-10-CM | POA: Diagnosis not present

## 2019-08-27 DIAGNOSIS — M79676 Pain in unspecified toe(s): Secondary | ICD-10-CM

## 2019-08-27 DIAGNOSIS — M722 Plantar fascial fibromatosis: Secondary | ICD-10-CM

## 2019-08-27 NOTE — Discharge Instructions (Signed)
°  You may take 500mg  acetaminophen every 4-6 hours or in combination with ibuprofen 400-600mg  every 6-8 hours as needed for pain and inflammation.  Please follow up with specialist as previously scheduled for recheck of symptoms.

## 2019-08-27 NOTE — ED Provider Notes (Signed)
[facility]    CSN: [id number] Arrival date & time: 08/27/19  1130      History   Chief Complaint Chief Complaint  Patient presents with  . Foot Pain    HPI Nancy Barrett is a 53 y.o. female.   HPI Nancy Barrett is a 53 y.o. female presenting to UC with c/o Right foot pain that started in June 2020 in the bottom of her heel.  She has been walking on the ball of her foot more recently to avoid the heel pain but has now developed pain and mild numbness in her little toe.  Pain is worse in the morning and she has noticed a little swelling the last few days. She has applied ice and used a tennis ball to roll under her foot, which provides temporary relief. 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  She has an appointment with a specialist in about 1 week but her husband encouraged her to be evaluated today for a possible fracture because pt has been "hobbleing around the house."  She has not taken an OTC medication for pain because she does not like taking medication.   Past Medical History:  Diagnosis Date  . Anemia   . Asthma   . Bronchitis   . Cervical high risk human papillomavirus (HPV) DNA test positive 03/02/2018  . Fibroids 12/26/2014  . Headache   . Hot flashes 12/15/2014  . Irregular periods 12/15/2014  . Melanoma ([place])    nose, removed  . Numbness in both hands   . Ovarian cyst 12/26/2014  . Peri-menopause 12/15/2014  . Thickened endometrium 03/03/2018   ?polyp, will get endo bx    Patient Active Problem List   Diagnosis Date Noted  . Left breast mass 10/26/2018  . Mastitis 10/26/2018  . Hyperparathyroidism, primary ([place]) 04/07/2018  . Osteopenia 04/07/2018  . Thickened endometrium 03/03/2018  . Cervical high risk human papillomavirus (HPV) DNA test positive 03/02/2018  . PMB (postmenopausal bleeding) 02/19/2018  . Postmenopausal 02/19/2018  . Hypercalcemia 02/19/2018  . Special screening for malignant neoplasms, colon 02/19/2018   . Vitamin D deficiency 02/19/2018  . Fibroids 12/26/2014  . Ovarian cyst 12/26/2014  . Hot flashes 12/15/2014  . Weight gain 12/15/2014  . Irregular periods 12/15/2014  . Peri-menopause 12/15/2014    Past Surgical History:  Procedure Laterality Date  . COLONOSCOPY N/A 02/08/2019   Procedure: COLONOSCOPY;  Surgeon: Danie Binder, MD;  [facility];  Service: Endoscopy;  Laterality: N/A;  9:30  . PARATHYROIDECTOMY N/A 07/17/2018   Procedure: MINIMALLY INVASIVE PARATHYROIDECTOMY ERAS PATHWAY;  Surgeon: Coralie Keens, MD;  Location: [facility];  Service: General;  Laterality: N/A;  . POLYPECTOMY  02/08/2019   Procedure: POLYPECTOMY;  Surgeon: Danie Binder, MD;  [facility]: [facility];  Service: Endoscopy;;  . SKIN CANCER EXCISION  2014   removed from nose  . TUBAL LIGATION      OB History    Gravida  3   Para  3   Term      Preterm      AB      Living  3     SAB      TAB      Ectopic      Multiple      Live Births               Home Medications    Prior to Admission medications  Medication Sig Start Date End Date Taking? Authorizing Provider  acetaminophen (TYLENOL) 500 MG tablet Take 1,000 mg by mouth every 6 (six) hours as needed (for pain.).    [provider]  Cholecalciferol (VITAMIN D-3) 125 MCG (5000 UT) TABS Take 5,000 Units by mouth daily.    [provider]  Multiple Vitamin (MULTIVITAMIN WITH MINERALS) TABS tablet Take 1 tablet by mouth daily.    [provider]    Family History Family History  Problem Relation Age of Onset  . Bronchitis Mother   . Arthritis Mother   . Hypertension Mother   . Hyperlipidemia Mother   . Heart attack Father   . Heart attack Paternal Uncle   . Other Maternal Grandfather        stomach problems  . Cancer Maternal Grandfather        colon cancer  . Heart attack Paternal Grandmother   . Stroke Paternal Grandfather   . Hypertension Brother   . Hypertension Brother    . Hypertension Brother   . Cancer Maternal Uncle        colon cancer  . Colon cancer Maternal Uncle   . Colon cancer Maternal Great-grandfather   . Colon polyps Neg Hx     Social History Social History   Tobacco Use  . Smoking status: Never Smoker  . Smokeless tobacco: Never Used  Substance Use Topics  . Alcohol use: No  . Drug use: No     Allergies   Sulfa antibiotics, Amoxicillin, and Penicillins   Review of Systems Review of Systems  Musculoskeletal: Positive for arthralgias and joint swelling.  Skin: Negative for color change and wound.  Neurological: Positive for numbness (right little toe). Negative for weakness.     Physical Exam Triage Vital Signs ED Triage Vitals  Enc Vitals Group     BP 08/27/19 1149 (!) 153/97     Pulse Rate 08/27/19 1149 68     Resp 08/27/19 1149 20     Temp 08/27/19 1149 98.2 F (36.8 C)     Temp src --      SpO2 08/27/19 1149 99 %     Weight --      Height --      Head Circumference --      Peak Flow --      Pain Score 08/27/19 1145 5     Pain Loc --      Pain Edu? --      Excl. in [place]? --    No data found.  Updated Vital Signs BP (!) 153/97   Pulse 68   Temp 98.2 F (36.8 C)   Resp 20   LMP 05/11/2018 (Exact Date)   SpO2 99%   Visual Acuity Right Eye Distance:   Left Eye Distance:   Bilateral Distance:    Right Eye Near:   Left Eye Near:    Bilateral Near:     Physical Exam Vitals signs and nursing note reviewed.  Constitutional:      Appearance: Normal appearance. She is well-developed.  HENT:     Head: Normocephalic and atraumatic.  Neck:     Musculoskeletal: Normal range of motion.  Cardiovascular:     Rate and Rhythm: Normal rate and regular rhythm.     Pulses:          Dorsalis pedis pulses are 2+ on the right side.       Posterior tibial pulses are 2+ on the right side.  Pulmonary:  Effort: Pulmonary effort is normal.  Musculoskeletal: Normal range of motion.        General: Swelling and  tenderness present.     Comments: Right foot: tenderness on bottom of heel and 5th MCP joint, mild tenderness of little to with mild swelling. Full ROM all toes and ankle.   Skin:    General: Skin is warm and dry.     Capillary Refill: Capillary refill takes less than 2 seconds.     Findings: No bruising or erythema.  Neurological:     General: No focal deficit present.     Mental Status: She is alert and oriented to person, place, and time.  Psychiatric:        Behavior: Behavior normal.      UC Treatments / Results  Labs (all labs ordered are listed, but only abnormal results are displayed) Labs Reviewed - No data to display  EKG   Radiology Dg Foot Complete Right  Result Date: 08/27/2019 CLINICAL DATA:  Right foot pain for 4 months EXAM: RIGHT FOOT COMPLETE - 3+ VIEW COMPARISON:  None. FINDINGS: No fracture or dislocation of the right foot. Joint spaces are well preserved. There is a small plantar calcaneal spur. There may be nonspecific soft tissue edema about the plantar and lateral aspect of the foot. IMPRESSION: 1. No fracture or dislocation of the right foot. Joint spaces are well preserved. 2. There is a small plantar calcaneal spur, which can be symptomatic. 3. There may be nonspecific soft tissue edema about the plantar and lateral aspect of the foot. Electronically Signed   By: Eddie Candle M.D.   On: 08/27/2019 12:52    Procedures Procedures (including critical care time)  Medications Ordered in UC Medications - No data to display  Initial Impression / Assessment and Plan / UC Course  I have reviewed the triage vital signs and the nursing notes.  Pertinent labs & imaging results that were available during my care of the patient were reviewed by me and considered in my medical decision making (see chart for details).     Reviewed imaging with pt Encouraged OTC medication as needed for pain Can continue use of ice and tennis ball  AVS provided.  Final  Clinical Impressions(s) / UC Diagnoses   Final diagnoses:  Right foot pain  Plantar fasciitis, right  Heel spur, right  Pain of fifth toe     Discharge Instructions      You may take 500mg  acetaminophen every 4-6 hours or in combination with ibuprofen 400-600mg  every 6-8 hours as needed for pain and inflammation.  Please follow up with specialist as previously scheduled for recheck of symptoms.     ED Prescriptions    None     PDMP not reviewed this encounter.   Noe Gens, [place] 08/27/19 1341

## 2019-08-27 NOTE — ED Triage Notes (Signed)
Pt states that her right foot has been hurting since June, hurts worse in the morning , has swelling , denies injury

## 2019-09-10 ENCOUNTER — Encounter: Payer: Self-pay | Admitting: Orthopedic Surgery

## 2019-09-10 ENCOUNTER — Other Ambulatory Visit: Payer: Self-pay

## 2019-09-10 ENCOUNTER — Ambulatory Visit: Payer: BC Managed Care – PPO | Admitting: Orthopedic Surgery

## 2019-09-10 VITALS — BP 131/71 | HR 82 | Ht 65.0 in | Wt 147.0 lb

## 2019-09-10 DIAGNOSIS — M722 Plantar fascial fibromatosis: Secondary | ICD-10-CM | POA: Diagnosis not present

## 2019-09-10 MED ORDER — MELOXICAM 7.5 MG PO TABS: 7.5000 mg | ORAL_TABLET | Freq: Two times a day (BID) | ORAL | 5 refills | Status: DC

## 2019-09-10 NOTE — Progress Notes (Signed)
Nancy Barrett  09/10/2019  HISTORY SECTION :  Chief Complaint  Patient presents with  . Foot Pain    right/ since June   Nancy Barrett is 53 years old she comes in with moderate right heel and plantar pain right fifth digit since June associated with start up weightbearing pain and difficulty walking on her heel.  She says her heel started hurting she tried to use a tennis ball and some Aleve but wound up walking on the ball of her foot which caused her right fifth digit to hurt under the fifth metatarsal   Review of Systems  Musculoskeletal: Positive for joint pain.  All other systems reviewed and are negative.    has a past medical history of Anemia, Asthma, Bronchitis, Cervical high risk human papillomavirus (HPV) DNA test positive (03/02/2018), Fibroids (12/26/2014), Headache, Hot flashes (12/15/2014), Irregular periods (12/15/2014), Melanoma (Nancy Barrett), Numbness in both hands, Ovarian cyst (12/26/2014), Peri-menopause (12/15/2014), and Thickened endometrium (03/03/2018).   Past Surgical History:  Procedure Laterality Date  . COLONOSCOPY N/A 02/08/2019   Procedure: COLONOSCOPY;  Surgeon: Danie Binder, MD;  [facility];  Service: Endoscopy;  Laterality: N/A;  9:30  . PARATHYROIDECTOMY N/A 07/17/2018   Procedure: MINIMALLY INVASIVE PARATHYROIDECTOMY ERAS PATHWAY;  Surgeon: Coralie Keens, MD;  Location: [facility];  Service: General;  Laterality: N/A;  . POLYPECTOMY  02/08/2019   Procedure: POLYPECTOMY;  Surgeon: Danie Binder, MD;  [facility];  Service: Endoscopy;;  . SKIN CANCER EXCISION  2014   removed from nose  . TUBAL LIGATION      Body mass index is 24.46 kg/m.   Allergies  Allergen Reactions  . Sulfa Antibiotics   . Amoxicillin Hives    Has patient had a PCN reaction causing immediate rash, facial/tongue/throat swelling, SOB or lightheadedness with hypotension: Unknown Has patient had a PCN reaction causing severe rash involving mucus membranes or skin necrosis:  Unknown Has patient had a PCN reaction that required hospitalization: No Has patient had a PCN reaction occurring within the last 10 years: No If all of the above answers are "NO", then may proceed with Cephalosporin use.    Marland Kitchen Penicillins Hives    Has patient had a PCN reaction causing immediate rash, facial/tongue/throat swelling, SOB or lightheadedness with hypotension: Unknown Has patient had a PCN reaction causing severe rash involving mucus membranes or skin necrosis: Unknown Has patient had a PCN reaction that required hospitalization: No Has patient had a PCN reaction occurring within the last 10 years: No If all of the above answers are "NO", then may proceed with Cephalosporin use.      Current Outpatient Medications:  .  acetaminophen (TYLENOL) 500 MG tablet, Take 1,000 mg by mouth every 6 (six) hours as needed (for pain.)., Disp: , Rfl:  .  Cholecalciferol (VITAMIN D-3) 125 MCG (5000 UT) TABS, Take 5,000 Units by mouth daily., Disp: , Rfl:  .  Multiple Vitamin (MULTIVITAMIN WITH MINERALS) TABS tablet, Take 1 tablet by mouth daily., Disp: , Rfl:  .  naproxen sodium (ALEVE) 220 MG tablet, Take 220 mg by mouth., Disp: , Rfl:  .  meloxicam (MOBIC) 7.5 MG tablet, Take 1 tablet (7.5 mg total) by mouth 2 (two) times daily., Disp: 30 tablet, Rfl: 5   PHYSICAL EXAM SECTION: 1) BP 131/71   Pulse 82   Ht 5\' 5"  (1.651 m)   Wt 147 lb (66.7 kg)   LMP 05/11/2018 (Exact Date)   BMI 24.46 kg/m   Body mass  index is 24.46 kg/m. General appearance: Well-developed well-nourished no gross deformities  2) Cardiovascular normal pulse and perfusion in the lower extremities normal color without edema  3) Neurologically deep tendon reflexes are equal and normal, no sensation loss or deficits no pathologic reflexes  4) Psychological: Awake alert and oriented x3 mood and affect normal  5) Skin no lacerations or ulcerations no nodularity no palpable masses, no erythema or nodularity  6)  Musculoskeletal:   Left foot normal arch right foot normal arch  Right foot tenderness plantar heel and fifth metatarsal ankle motion normal heel cord seems normally stretched with normal range of motion of the ankle joint and no instability of the ankle muscle tone normal without atrophy   MEDICAL DECISION SECTION:  Encounter Diagnosis  Name Primary?  . Plantar fasciitis of right foot Yes    Imaging Outside imaging shows no fracture dislocation or bony abnormality  Plan:  (Rx., Inj., surg., Frx, MRI/CT, XR:2)  Diagnosis plantar fasciitis  We will inject the heel Try heel cups Stretching exercises Meloxicam Ice  Follow-up as needed  Procedure note Inject plantar fascia    Timeout was completed to confirm the site of injection right plantar fascia insertion  The medications used were 40 mg of Depo-Medrol and 1% lidocaine 3 cc  Anesthesia was provided by ethyl chloride and the skin was prepped with alcohol.  After cleaning the skin with alcohol a 25-gauge needle was used to inject the plantar fascia, no complications were noted sterile bandage was applied   Meds ordered this encounter  Medications  . meloxicam (MOBIC) 7.5 MG tablet    Sig: Take 1 tablet (7.5 mg total) by mouth 2 (two) times daily.    Dispense:  30 tablet    Refill:  5     8:54 AM Arther Abbott, MD  09/10/2019

## 2019-09-10 NOTE — Patient Instructions (Signed)
Ice 2 x a day   Exercises 2 x a day  meloxicam 2 x a day   Heel cups   You have received an injection of steroids into the joint. 15% of patients will have increased pain within the 24 hours postinjection.   This is transient and will go away.   We recommend that you use ice packs on the injection site for 20 minutes every 2 hours and extra strength Tylenol 2 tablets every 8 as needed until the pain resolves.  If you continue to have pain after taking the Tylenol and using the ice please call the office for further instructions.

## 2019-12-14 ENCOUNTER — Ambulatory Visit: Payer: Self-pay | Admitting: "Endocrinology

## 2020-03-15 ENCOUNTER — Ambulatory Visit: Payer: BC Managed Care – PPO | Admitting: Adult Health

## 2020-03-20 ENCOUNTER — Ambulatory Visit (INDEPENDENT_AMBULATORY_CARE_PROVIDER_SITE_OTHER): Payer: 59 | Admitting: Adult Health

## 2020-03-20 ENCOUNTER — Other Ambulatory Visit: Payer: Self-pay

## 2020-03-20 ENCOUNTER — Encounter: Payer: Self-pay | Admitting: Adult Health

## 2020-03-20 VITALS — BP 148/88 | HR 65 | Ht 66.0 in | Wt 154.0 lb

## 2020-03-20 DIAGNOSIS — N760 Acute vaginitis: Secondary | ICD-10-CM

## 2020-03-20 DIAGNOSIS — B9689 Other specified bacterial agents as the cause of diseases classified elsewhere: Secondary | ICD-10-CM

## 2020-03-20 DIAGNOSIS — N898 Other specified noninflammatory disorders of vagina: Secondary | ICD-10-CM

## 2020-03-20 LAB — POCT WET PREP (WET MOUNT)
Clue Cells Wet Prep Whiff POC: POSITIVE
WBC, Wet Prep HPF POC: POSITIVE

## 2020-03-20 MED ORDER — METRONIDAZOLE 500 MG PO TABS: 500.0000 mg | ORAL_TABLET | Freq: Two times a day (BID) | ORAL | 0 refills | Status: DC

## 2020-03-20 NOTE — Progress Notes (Signed)
  Subjective:     Patient ID: Nancy Barrett, female   DOB: 1966-07-03, 54 y.o.   MRN: [id number]  HPI Ky is a 54 year old white female,married, PM in complaining of vaginal discharge with odor since January.   Review of Systems Started in January with runny milky discharge with odor,has used OTC monistat and seems better now, had some pink at times, from being irritated.  Reviewed past medical,surgical, social and family history. Reviewed medications and allergies.     Objective:   Physical Exam BP (!) 148/88 (BP Location: Left Arm, Patient Position: Sitting, Cuff Size: Normal)   Pulse 65   Ht 5\' 6"  (1.676 m)   Wt 154 lb (69.9 kg)   LMP 05/11/2018 (Exact Date)   BMI 24.86 kg/m  Skin warm and dry.Pelvic: external genitalia is normal in appearance no lesions, vagina:scant white discharge with odor,urethra has small carbuncle, cervix:smooth and bulbous, uterus: normal size, shape and contour, non tender, no masses felt, adnexa: no masses or tenderness noted. Bladder is non tender and no masses felt. Wet prep: + for clue cells and +WBCs. Fall risk is low Examination chaperoned by Levy Pupa LPN    Assessment:     1. Vaginal discharge  2. Vaginal odor  3. BV (bacterial vaginosis) Will rx flagyl Meds ordered this encounter  Medications  . metroNIDAZOLE (FLAGYL) 500 MG tablet    Sig: Take 1 tablet (500 mg total) by mouth 2 (two) times daily.    Dispense:  14 tablet    Refill:  0    Order Specific Question:   Supervising Provider    Answer:   Florian Buff [[id number]]      Plan:     Follow up prn

## 2020-09-30 IMAGING — MG DIGITAL DIAGNOSTIC UNILATERAL LEFT MAMMOGRAM WITH TOMO AND CAD
7 series · 9 of 15 positions shown · non-contrast
Comparison: 03/10/2018 and earlier priors

CLINICAL DATA: Six-month follow-up of probably benign
calcifications in the left breast.

EXAM:
DIGITAL DIAGNOSTIC UNILATERAL LEFT MAMMOGRAM WITH CAD AND TOMO

[L ML]
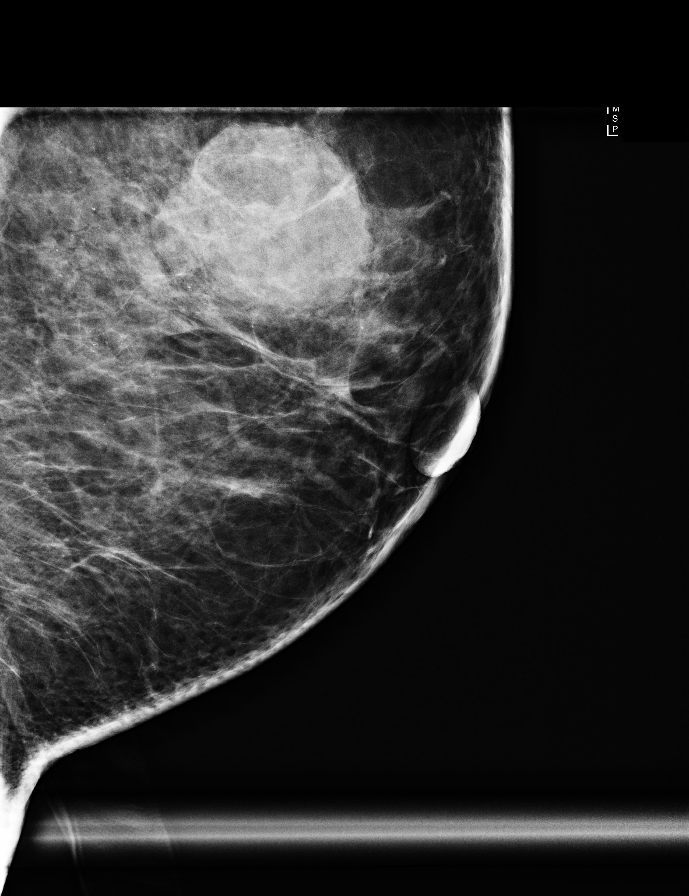

[L CC (1 of 2)]
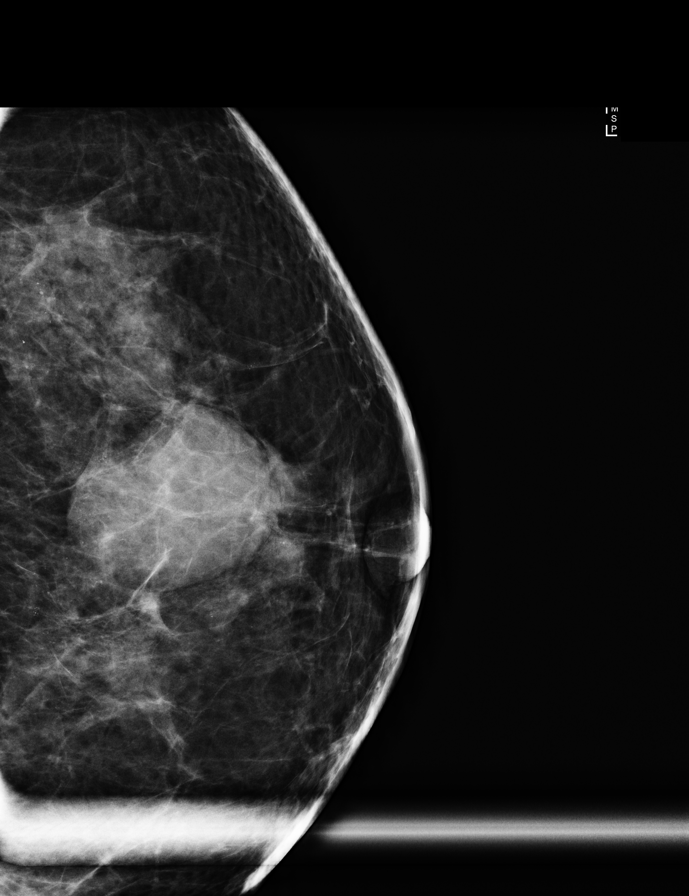

[L CC (2 of 2)]
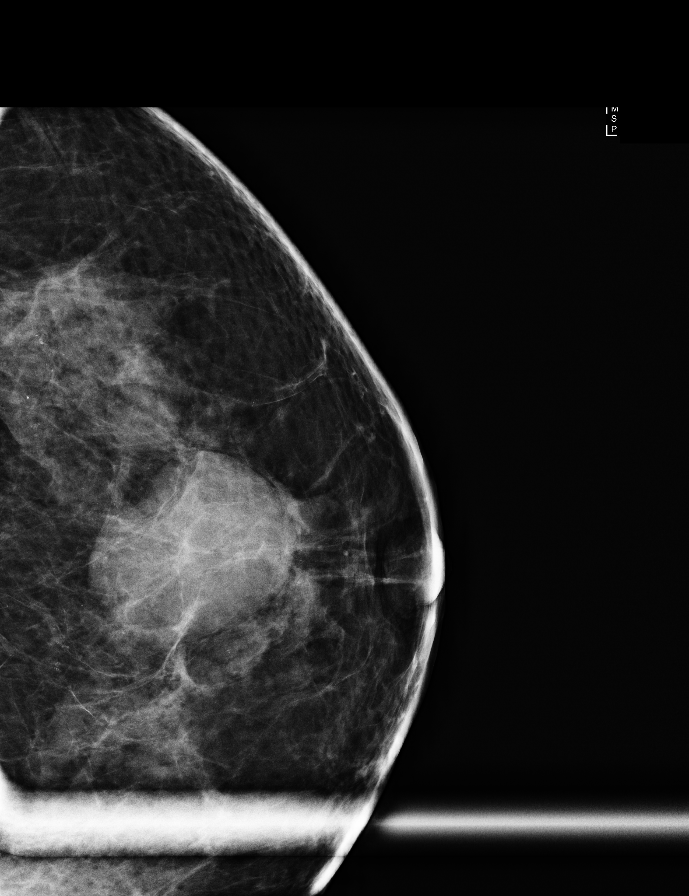

[L MLO synth-2D]
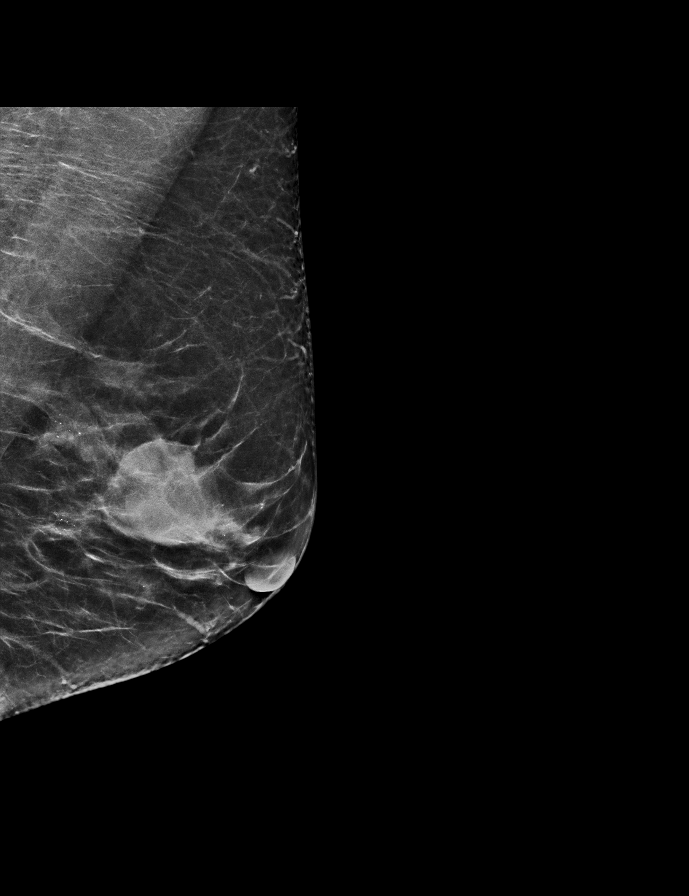

[L CC synth-2D]
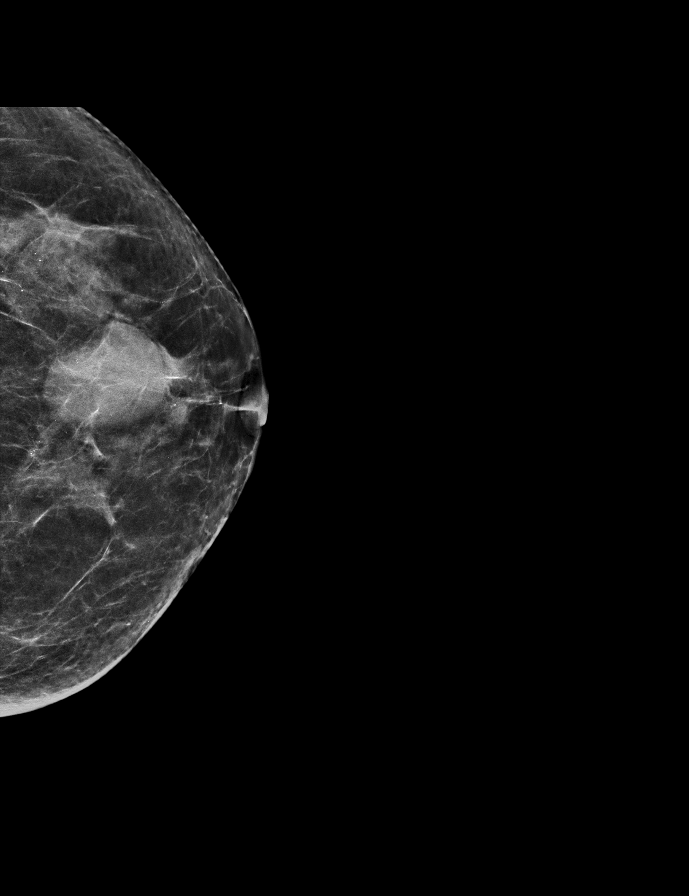

[L MLO tomo · 3 of 61 frames shown]
[frame 20/61]
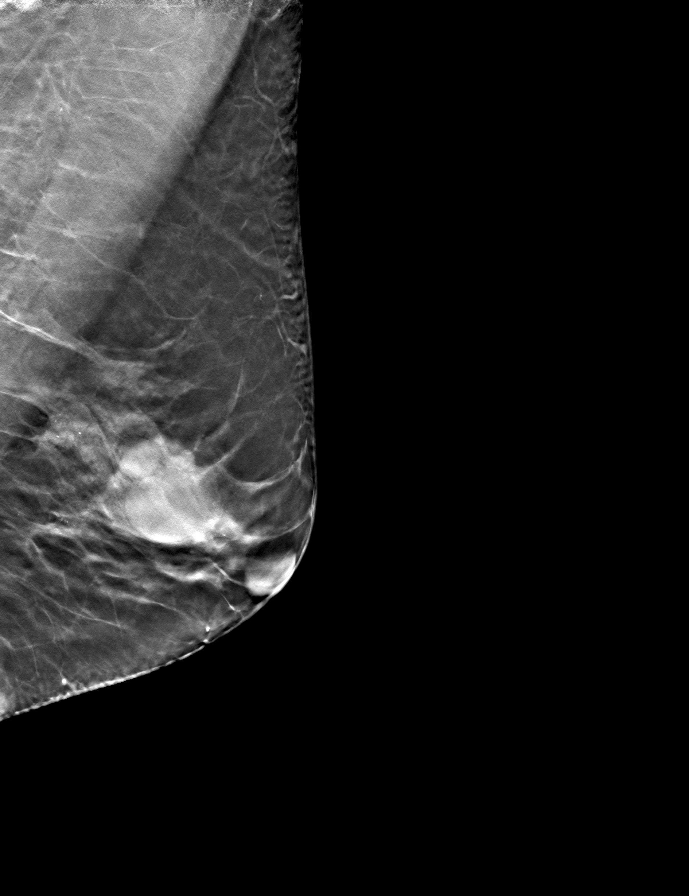
[frame 31/61]
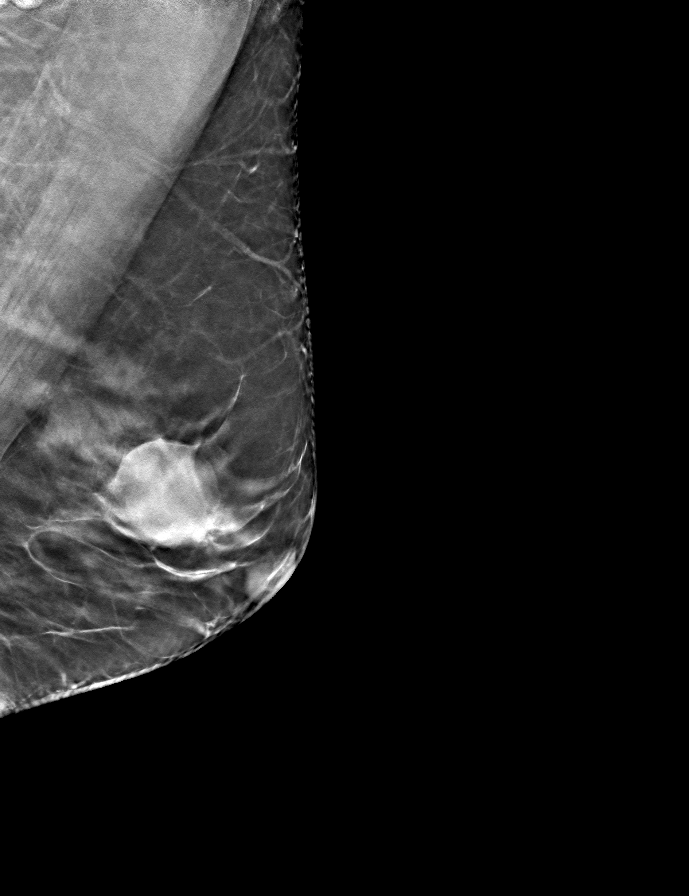
[frame 42/61]
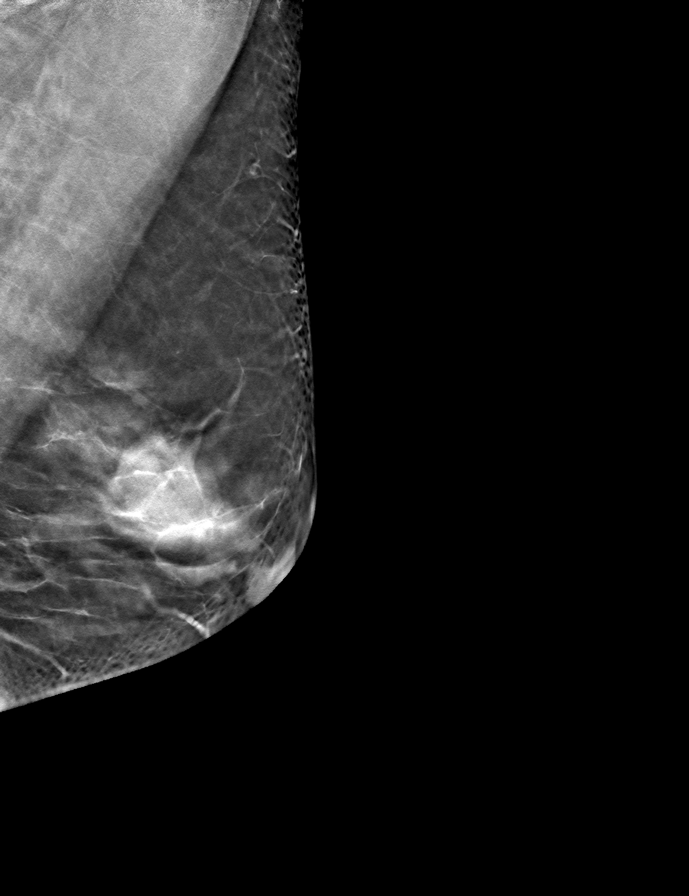

[L CC tomo · tomo slice 29/58.0]
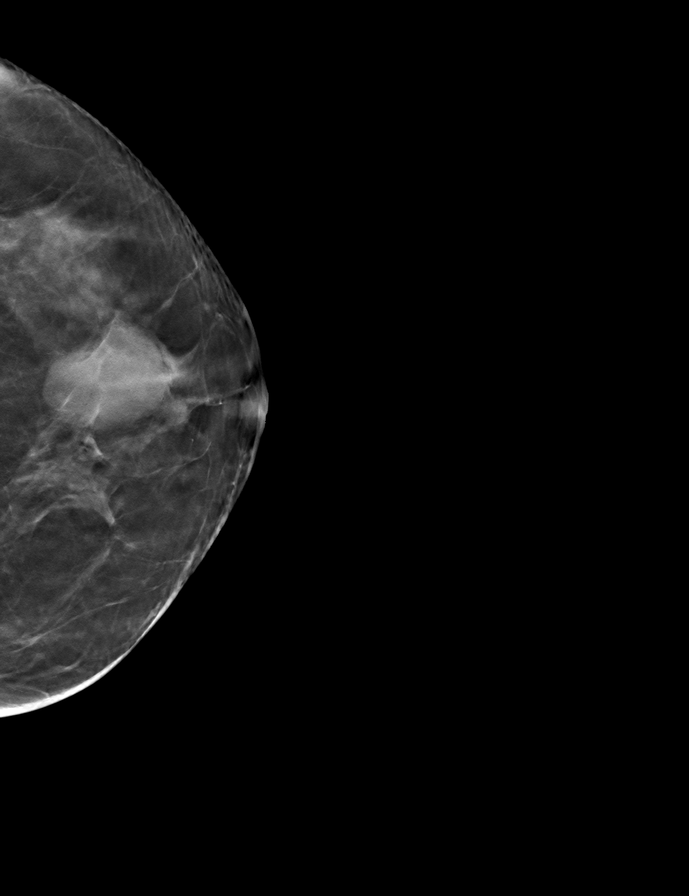

[9 of 15 positions shown; findings below may reference images not displayed]

ACR Breast Density Category c: The breast tissue is heterogeneously
dense, which may obscure small masses.
FINDINGS: Magnification views of the outer and central left breast show widely
scattered punctate and round calcifications, that are stable in
appearance compared to prior mammogram March 2018. These are
probably benign. Circumscribed masses in the left breast or known to
be cysts based on prior imaging. No architectural distortion in the
left breast.

Mammographic images were processed with CAD.
IMPRESSION: Stable probably benign widely scattered calcifications in the left
breast.

RECOMMENDATION:
Bilateral diagnostic mammogram is recommended in March 2019 to
complete a 1 year follow-up.

I have discussed the findings and recommendations with the patient.
Results were also provided in writing at the conclusion of the
visit. If applicable, a reminder letter will be sent to the patient
regarding the next appointment.

BI-RADS CATEGORY  3: Probably benign.

## 2020-10-19 ENCOUNTER — Ambulatory Visit (HOSPITAL_COMMUNITY): Payer: 59 | Attending: Sports Medicine | Admitting: Physical Therapy

## 2020-10-19 ENCOUNTER — Other Ambulatory Visit: Payer: Self-pay

## 2020-10-19 ENCOUNTER — Encounter (HOSPITAL_COMMUNITY): Payer: Self-pay | Admitting: Physical Therapy

## 2020-10-19 DIAGNOSIS — M6281 Muscle weakness (generalized): Secondary | ICD-10-CM | POA: Diagnosis present

## 2020-10-19 DIAGNOSIS — R262 Difficulty in walking, not elsewhere classified: Secondary | ICD-10-CM | POA: Diagnosis present

## 2020-10-19 DIAGNOSIS — M79671 Pain in right foot: Secondary | ICD-10-CM | POA: Diagnosis present

## 2020-10-19 DIAGNOSIS — M25551 Pain in right hip: Secondary | ICD-10-CM | POA: Diagnosis present

## 2020-10-19 NOTE — Therapy (Signed)
[place], [place], [address] Phone: [phone]   Fax:  [phone]  Physical Therapy Evaluation  Patient Details  Name: Nancy Barrett MRN: [id number] Date of Birth: October 26, 1966 Referring Provider (PT): Pedro Earls   Encounter Date: 10/19/2020   PT End of Session - 10/19/20 1350    Visit Number 1    Number of Visits 8    Date for PT Re-Evaluation 11/30/20    Authorization Type [employer] no auth, VL [id number]    PT Start Time 6203   late due to checkin   PT Stop Time 1346    PT Time Calculation (min) 25 min    Activity Tolerance Patient tolerated treatment well    Behavior During Therapy [facility] for tasks assessed/performed           Past Medical History:  Diagnosis Date  . Anemia   . Asthma   . Bronchitis   . Cervical high risk human papillomavirus (HPV) DNA test positive 03/02/2018  . Fibroids 12/26/2014  . Headache   . Hot flashes 12/15/2014  . Irregular periods 12/15/2014  . Melanoma ([place])    nose, removed  . Numbness in both hands   . Ovarian cyst 12/26/2014  . Peri-menopause 12/15/2014  . Thickened endometrium 03/03/2018   ?polyp, will get endo bx    Past Surgical History:  Procedure Laterality Date  . COLONOSCOPY N/A 02/08/2019   Procedure: COLONOSCOPY;  Surgeon: Danie Binder, MD;  [facility];  Service: Endoscopy;  Laterality: N/A;  9:30  . PARATHYROIDECTOMY N/A 07/17/2018   Procedure: MINIMALLY INVASIVE PARATHYROIDECTOMY ERAS PATHWAY;  Surgeon: Coralie Keens, MD;  Location: [facility];  Service: General;  Laterality: N/A;  . POLYPECTOMY  02/08/2019   Procedure: POLYPECTOMY;  Surgeon: Danie Binder, MD;  [facility];  Service: Endoscopy;;  . SKIN CANCER EXCISION  2014   removed from nose  . TUBAL LIGATION      There were no vitals filed for this visit.    Subjective Assessment - 10/19/20 1327    Subjective States her pain started in her right foot about a year and a  half ago. States she got an injection and it helped but she still has difficulty with walking and running. 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 States she was walking on her toes about a year and half ago and thinks she damaged something. States that her buttocks is currently hurting but her pain is mostly at night and in the morning when she gets up. Stats that she also has occasional tingling on the lateral side of her knee. States pain along lateral side of her foot at the base of the 5th metatarsal. Current pain in buttocks Is 3/10 achy.    Currently in Pain? Yes    Pain Score 3     Pain Location Leg    Pain Orientation Right    Pain Descriptors / Indicators Aching    Pain Radiating Towards down leg into knee and foot    Pain Onset More than a month ago    Pain Frequency Intermittent    Aggravating Factors  standign,walking, worse in the morning    Pain Relieving Factors massage              [facility] PT Assessment - 10/19/20 0001      Assessment   Medical Diagnosis right foot and hip pain    Referring Provider (  PT) Pedro Earls      Balance Screen   Has the patient fallen in the past 6 months Yes    How many times? 1   down steps -  one step is shorter then other   Has the patient had a decrease in activity level because of a fear of falling?  No    Is the patient reluctant to leave their home because of a fear of falling?  No      Observation/Other Assessments   Focus on Therapeutic Outcomes (FOTO)  53.7% function       ROM / Strength   AROM / PROM / Strength AROM;Strength      AROM   AROM Assessment Site Ankle;Lumbar;Hip    Right/Left Hip Left;Right    Right Hip Extension 10    Right Hip Flexion --   WFL - feels good   Right Hip External Rotation  --   WFL - feels good   Right Hip Internal Rotation  --   WFL - feels good   Left Hip Extension 10    Left Hip Flexion --   WFL - feels good   Left Hip External Rotation  --   WFL -  feels good   Left Hip Internal Rotation  --   WFL - feels good   Right/Left Ankle Right;Left    Lumbar Flexion 50% limited    painin hip felt better   Lumbar Extension 50% limited   painin hip felt better   Lumbar - Right Side Bend 25% limited   feels good   Lumbar - Left Side Bend 25% limited    feels good     Strength   Strength Assessment Site Ankle;Hip    Right/Left Hip Left;Right    Right Hip Extension 4/5   slight pain in hip   Left Hip Extension 4+/5    Right/Left Ankle Right;Left      Palpation   Spinal mobility spring test on lumbar spine hypomobility in L4-5     Palpation comment tenderness noted in right glut med with reproduced symptoms with palpation down legs      Special Tests   Other special tests hamstring length lacking 5 bilaterally and stretch felt and ely's test negative bilaterally.                          Objective measurements completed on examination: See above findings.       [place] Adult PT Treatment/Exercise - 10/19/20 0001      Exercises   Exercises Lumbar      Lumbar Exercises: Stretches   Other Lumbar Stretch Exercise self mobilization to right glute med with tennis ball - 5 minutes                  PT Education - 10/19/20 1357    Education Details on FOTO score, current condition and POC moving forward.    Person(s) Educated Patient    Methods Explanation    Comprehension Verbalized understanding            PT Short Term Goals - 10/19/20 1353      PT SHORT TERM GOAL #1   Title Patient will report at least 50% improvement in overall symptoms and/or function to demonstrate improved functional mobility    Time 4    Period Weeks    Status New    Target Date 11/16/20      PT  SHORT TERM GOAL #2   Title Patient will be independent in self management strategies to improve quality of life and functional outcomes.    Time 4    Period Weeks    Status New    Target Date 11/16/20      PT SHORT TERM GOAL #3    Title Patient will report being able to walk a mile without difficutly to improve ability to walk at work.    Time 4    Period Weeks    Status New    Target Date 11/16/20             PT Long Term Goals - 10/19/20 1354      PT LONG TERM GOAL #1   Title Patient will report being able to sleep and wake up in the morning without pain in hip/leg.    Time 6    Period Weeks    Status New    Target Date 11/30/20      PT LONG TERM GOAL #2   Title Patient will improve on FOTO score to meet predicted outcomes to demonstrate improved functional mobility.    Time 6    Period Weeks    Status New    Target Date 11/30/20      PT LONG TERM GOAL #3   Title Patient will report at least 75% improvement in overall symptoms and/or function to demonstrate improved functional mobility    Time 6    Period Weeks    Status New    Target Date 11/30/20                  Plan - 10/19/20 1351    Clinical Impression Statement Patient presents to therapy with complaint of right hip, knee and ankle pain along with history of plantar fasciitis. Upon evaluation able to reproduce radicular symptoms with palpation of right glute med. Improved symptoms with self mobilization. Educated patient on findings and patient would greatly benefit from skilled physical therapy to improve functional mobility and return her to optimal function.    Personal Factors and Comorbidities Comorbidity 1    Comorbidities hx of skin cancer    Examination-Activity Limitations Locomotion Level;Stand;Stairs;Squat;Sleep    Examination-Participation Restrictions Occupation;Yard Work;Community Activity    Stability/Clinical Decision Making Stable/Uncomplicated    Rehab Potential Good    PT Frequency --   1-2x/week for total of 8 visits over 6 week certification   PT Duration 6 weeks    PT Treatment/Interventions ADLs/Self Care Home Management;Cryotherapy;Electrical Stimulation;Moist Heat;Traction;Balance training;Therapeutic  exercise;Therapeutic activities;Functional mobility training;Stair training;Gait training;Neuromuscular re-education;Patient/family education;Manual techniques;Dry needling;Joint Manipulations    PT Next Visit Plan STM to R glute med, hip and lumbar ROM, assess hip abd strength, add in hip strength, equal WB on both legs (favors L wiht right hip hike). possible DN candidate if symptoms don't resolve. STM to glute med    PT Home Exercise Plan lumbar flexion and extension in standing, self mobilziation to right glut med    Consulted and Agree with Plan of Care Patient           Patient will benefit from skilled therapeutic intervention in order to improve the following deficits and impairments:  Pain, Difficulty walking, Decreased range of motion, Decreased strength, Decreased activity tolerance  Visit Diagnosis: Difficulty in walking, not elsewhere classified  Muscle weakness (generalized)  Pain in right hip  Pain in right foot     Problem List Patient Active Problem List   Diagnosis Date Noted  .  BV (bacterial vaginosis) 03/20/2020  . Vaginal odor 03/20/2020  . Vaginal discharge 03/20/2020  . Left breast mass 10/26/2018  . Mastitis 10/26/2018  . Hyperparathyroidism, primary ([place]) 04/07/2018  . Osteopenia 04/07/2018  . Thickened endometrium 03/03/2018  . Cervical high risk human papillomavirus (HPV) DNA test positive 03/02/2018  . PMB (postmenopausal bleeding) 02/19/2018  . Postmenopausal 02/19/2018  . Hypercalcemia 02/19/2018  . Special screening for malignant neoplasms, colon 02/19/2018  . Vitamin D deficiency 02/19/2018  . Fibroids 12/26/2014  . Ovarian cyst 12/26/2014  . Hot flashes 12/15/2014  . Weight gain 12/15/2014  . Irregular periods 12/15/2014  . Peri-menopause 12/15/2014  1:58 PM, 10/19/20 Jerene Pitch, DPT Physical Therapy with [facility]  [phone] office  [place], [place], [address] Phone: [phone]   Fax:  [phone]  Name: Nancy Barrett MRN: [id number] Date of Birth: 1966-04-19

## 2020-10-23 ENCOUNTER — Ambulatory Visit (HOSPITAL_COMMUNITY): Payer: 59 | Admitting: Physical Therapy

## 2020-10-23 ENCOUNTER — Other Ambulatory Visit: Payer: Self-pay

## 2020-10-23 DIAGNOSIS — R262 Difficulty in walking, not elsewhere classified: Secondary | ICD-10-CM

## 2020-10-23 DIAGNOSIS — M25551 Pain in right hip: Secondary | ICD-10-CM

## 2020-10-23 DIAGNOSIS — M6281 Muscle weakness (generalized): Secondary | ICD-10-CM

## 2020-10-23 DIAGNOSIS — M79671 Pain in right foot: Secondary | ICD-10-CM

## 2020-10-23 NOTE — Therapy (Signed)
[place], [place], [address] Phone: [phone]   Fax:  [phone]  Physical Therapy Treatment  Patient Details  Name: Nancy Barrett MRN: [id number] Date of Birth: 07/19/1966 Referring Provider (PT): Pedro Earls   Encounter Date: 10/23/2020   PT End of Session - 10/23/20 1339    Visit Number 2    Number of Visits 8    Date for PT Re-Evaluation 11/30/20    Authorization Type [employer] no auth, VL 30    PT Start Time 1135    PT Stop Time 1215    PT Time Calculation (min) 40 min    Activity Tolerance Patient tolerated treatment well    Behavior During Therapy [facility] for tasks assessed/performed           Past Medical History:  Diagnosis Date  . Anemia   . Asthma   . Bronchitis   . Cervical high risk human papillomavirus (HPV) DNA test positive 03/02/2018  . Fibroids 12/26/2014  . Headache   . Hot flashes 12/15/2014  . Irregular periods 12/15/2014  . Melanoma ([place])    nose, removed  . Numbness in both hands   . Ovarian cyst 12/26/2014  . Peri-menopause 12/15/2014  . Thickened endometrium 03/03/2018   ?polyp, will get endo bx    Past Surgical History:  Procedure Laterality Date  . COLONOSCOPY N/A 02/08/2019   Procedure: COLONOSCOPY;  Surgeon: Danie Binder, MD;  [facility];  Service: Endoscopy;  Laterality: N/A;  9:30  . PARATHYROIDECTOMY N/A 07/17/2018   Procedure: MINIMALLY INVASIVE PARATHYROIDECTOMY ERAS PATHWAY;  Surgeon: Coralie Keens, MD;  [facility];  Service: General;  Laterality: N/A;  . POLYPECTOMY  02/08/2019   Procedure: POLYPECTOMY;  Surgeon: Danie Binder, MD;  [facility];  Service: Endoscopy;;  . SKIN CANCER EXCISION  2014   removed from nose  . TUBAL LIGATION      There were no vitals filed for this visit.   Subjective Assessment - 10/23/20 1145    Subjective Pt states she's been using the tennis ball on her foot and hip and completing her HEP  stretches.  STates she's having pain in her foot when she first gets up but no hip pain.    Currently in Pain? No/denies              [facility] PT Assessment - 10/23/20 0001      Strength   Right Hip ABduction 4/5    Left Hip ABduction 5/5                         [facility] Adult PT Treatment/Exercise - 10/23/20 0001      Lumbar Exercises: Stretches   Piriformis Stretch Right;Left;3 reps;30 seconds    Piriformis Stretch Limitations seated      Lumbar Exercises: Supine   Bridge 15 reps      Lumbar Exercises: Sidelying   Hip Abduction Both;10 reps      Lumbar Exercises: Prone   Straight Leg Raise 10 reps      Manual Therapy   Manual Therapy Soft tissue mobilization    Manual therapy comments completed seperately from all other skilled interventions    Soft tissue mobilization Prone to Rt SI/glute region to reduce pain/spasm                  PT Education - 10/23/20 1343    Education Details  Reviewed goals, HEP and POC moving forward    Person(s) Educated Patient    Methods Explanation    Comprehension Verbalized understanding            PT Short Term Goals - 10/23/20 1344      PT SHORT TERM GOAL #1   Title Patient will report at least 50% improvement in overall symptoms and/or function to demonstrate improved functional mobility    Time 4    Period Weeks    Status On-going    Target Date 11/16/20      PT SHORT TERM GOAL #2   Title Patient will be independent in self management strategies to improve quality of life and functional outcomes.    Time 4    Period Weeks    Status On-going    Target Date 11/16/20      PT SHORT TERM GOAL #3   Title Patient will report being able to walk a mile without difficutly to improve ability to walk at work.    Time 4    Period Weeks    Status On-going    Target Date 11/16/20             PT Long Term Goals - 10/23/20 1344      PT LONG TERM GOAL #1   Title Patient will report being able to sleep  and wake up in the morning without pain in hip/leg.    Time 6    Period Weeks    Status On-going      PT LONG TERM GOAL #2   Title Patient will improve on FOTO score to meet predicted outcomes to demonstrate improved functional mobility.    Time 6    Period Weeks    Status On-going      PT LONG TERM GOAL #3   Title Patient will report at least 75% improvement in overall symptoms and/or function to demonstrate improved functional mobility    Time 6    Period Weeks    Status On-going                 Plan - 10/23/20 1451    Clinical Impression Statement REviewed goals and POC moving forward.  Initiated new therex to include piriformis stretch and LE strengthening exercises. Tested strength of bil hip abductors with Rt showing weakness, Lt WNL.  Completed manual at end of session to Rt glute with noted sensitivity but minimal tightness/spasm palpated.  Trigger point obtained in Rt SI region which induced pain into Rt foot.   Pt reported overall imrpovement at end of session.    Personal Factors and Comorbidities Comorbidity 1    Comorbidities hx of skin cancer    Examination-Activity Limitations Locomotion Level;Stand;Stairs;Squat;Sleep    Examination-Participation Restrictions Occupation;Yard Work;Community Activity    Stability/Clinical Decision Making Stable/Uncomplicated    Rehab Potential Good    PT Frequency --   1-2x/week for total of 8 visits over 6 week certification   PT Duration 6 weeks    PT Treatment/Interventions ADLs/Self Care Home Management;Cryotherapy;Electrical Stimulation;Moist Heat;Traction;Balance training;Therapeutic exercise;Therapeutic activities;Functional mobility training;Stair training;Gait training;Neuromuscular re-education;Patient/family education;Manual techniques;Dry needling;Joint Manipulations    PT Next Visit Plan continue strengthening and manual.  possible DN candidate if symptoms don't resolve. STM to glute med    PT Home Exercise Plan lumbar  flexion and extension in standing, self mobilziation to right glut med    Consulted and Agree with Plan of Care Patient  Patient will benefit from skilled therapeutic intervention in order to improve the following deficits and impairments:  Pain, Difficulty walking, Decreased range of motion, Decreased strength, Decreased activity tolerance  Visit Diagnosis: Difficulty in walking, not elsewhere classified  Muscle weakness (generalized)  Pain in right hip  Pain in right foot     Problem List Patient Active Problem List   Diagnosis Date Noted  . BV (bacterial vaginosis) 03/20/2020  . Vaginal odor 03/20/2020  . Vaginal discharge 03/20/2020  . Left breast mass 10/26/2018  . Mastitis 10/26/2018  . Hyperparathyroidism, primary ([place]) 04/07/2018  . Osteopenia 04/07/2018  . Thickened endometrium 03/03/2018  . Cervical high risk human papillomavirus (HPV) DNA test positive 03/02/2018  . PMB (postmenopausal bleeding) 02/19/2018  . Postmenopausal 02/19/2018  . Hypercalcemia 02/19/2018  . Special screening for malignant neoplasms, colon 02/19/2018  . Vitamin D deficiency 02/19/2018  . Fibroids 12/26/2014  . Ovarian cyst 12/26/2014  . Hot flashes 12/15/2014  . Weight gain 12/15/2014  . Irregular periods 12/15/2014  . Peri-menopause 12/15/2014   Teena Irani, PTA/CLT [phone]  Teena Irani 10/23/2020, 2:54 PM  [place], [place], [address] Phone: [phone]   Fax:  [phone]  Name: Nancy Barrett MRN: [id number] Date of Birth: 23-Nov-1966

## 2020-11-01 ENCOUNTER — Other Ambulatory Visit: Payer: Self-pay

## 2020-11-01 ENCOUNTER — Ambulatory Visit (HOSPITAL_COMMUNITY): Payer: 59 | Attending: Sports Medicine | Admitting: Physical Therapy

## 2020-11-01 ENCOUNTER — Encounter (HOSPITAL_COMMUNITY): Payer: Self-pay | Admitting: Physical Therapy

## 2020-11-01 DIAGNOSIS — R262 Difficulty in walking, not elsewhere classified: Secondary | ICD-10-CM | POA: Diagnosis present

## 2020-11-01 DIAGNOSIS — M79671 Pain in right foot: Secondary | ICD-10-CM | POA: Insufficient documentation

## 2020-11-01 DIAGNOSIS — M25551 Pain in right hip: Secondary | ICD-10-CM | POA: Insufficient documentation

## 2020-11-01 DIAGNOSIS — M6281 Muscle weakness (generalized): Secondary | ICD-10-CM | POA: Insufficient documentation

## 2020-11-01 NOTE — Therapy (Signed)
[place], [place], [address] Phone: [phone]   Fax:  [phone]  Physical Therapy Treatment  Patient Details  Name: Nefertari Rebman MRN: [id number] Date of Birth: Jul 28, 1966 Referring Provider (PT): Pedro Earls   Encounter Date: 11/01/2020   PT End of Session - 11/01/20 1006    Visit Number 3    Number of Visits 8    Date for PT Re-Evaluation 11/30/20    Authorization Type [employer] no auth, VL 30    Authorization - Visit Number 3    Authorization - Number of Visits 30    PT Start Time [phone]    PT Stop Time 0955    PT Time Calculation (min) 32 min    Activity Tolerance Patient tolerated treatment well    Behavior During Therapy [facility] for tasks assessed/performed           Past Medical History:  Diagnosis Date  . Anemia   . Asthma   . Bronchitis   . Cervical high risk human papillomavirus (HPV) DNA test positive 03/02/2018  . Fibroids 12/26/2014  . Headache   . Hot flashes 12/15/2014  . Irregular periods 12/15/2014  . Melanoma ([place])    nose, removed  . Numbness in both hands   . Ovarian cyst 12/26/2014  . Peri-menopause 12/15/2014  . Thickened endometrium 03/03/2018   ?polyp, will get endo bx    Past Surgical History:  Procedure Laterality Date  . COLONOSCOPY N/A 02/08/2019   Procedure: COLONOSCOPY;  Surgeon: Danie Binder, MD;  [facility];  Service: Endoscopy;  Laterality: N/A;  9:30  . PARATHYROIDECTOMY N/A 07/17/2018   Procedure: MINIMALLY INVASIVE PARATHYROIDECTOMY ERAS PATHWAY;  Surgeon: Coralie Keens, MD;  Location: [facility];  Service: General;  Laterality: N/A;  . POLYPECTOMY  02/08/2019   Procedure: POLYPECTOMY;  Surgeon: Danie Binder, MD;  [facility];  Service: Endoscopy;;  . SKIN CANCER EXCISION  2014   removed from nose  . TUBAL LIGATION      There were no vitals filed for this visit.   Subjective Assessment - 11/01/20 1000    Subjective Patient  reports improved R foot/heel symptoms with use of heavy ball for rolling/pressure.  Patient reports increased activity levels over the past few days with increased time in standing and notable soreness accompanying increase in activity R hip > R heel with more discomfort vs pain.    Currently in Pain? No/denies    Pain Score 2     Pain Location Foot    Pain Orientation Right    Pain Descriptors / Indicators Discomfort    Pain Radiating Towards R hip, centralized    Pain Onset More than a month ago    Aggravating Factors  increased standing/squatting    Pain Relieving Factors massage and exercises              [facility] PT Assessment - 11/01/20 0001      Assessment   Medical Diagnosis right foot and hip pain    Referring Provider (PT) Pedro Earls      Strength   Right Hip Extension 4+/5      Palpation   Palpation comment tenderness noted in right glut med with reproduced symptoms with palpation down legs                         [facility] Adult PT Treatment/Exercise - 11/01/20 0001  Lumbar Exercises: Stretches   Standing Side Bend 10 seconds   5 sets     Lumbar Exercises: Prone   Straight Leg Raise 10 reps   3 sets, knee is flexed, emphasis on TRA contraction w/ lift.     Lumbar Exercises: Quadruped   Other Quadruped Lumbar Exercises pelvic tilts with emphasis on posterior tilt   2x10     Manual Therapy   Manual Therapy Soft tissue mobilization    Manual therapy comments completed seperately from all other skilled interventions    Soft tissue mobilization Prone to Rt SI/glute region to reduce pain/spasm. Notable tenderness in R QL                  PT Education - 11/01/20 1004    Education Details review of past HEP items and self-progression of new ther ex techniques    Person(s) Educated Patient    Methods Explanation;Demonstration    Comprehension Verbalized understanding;Returned demonstration            PT Short Term Goals - 10/23/20  1344      PT SHORT TERM GOAL #1   Title Patient will report at least 50% improvement in overall symptoms and/or function to demonstrate improved functional mobility    Time 4    Period Weeks    Status On-going    Target Date 11/16/20      PT SHORT TERM GOAL #2   Title Patient will be independent in self management strategies to improve quality of life and functional outcomes.    Time 4    Period Weeks    Status On-going    Target Date 11/16/20      PT SHORT TERM GOAL #3   Title Patient will report being able to walk a mile without difficutly to improve ability to walk at work.    Time 4    Period Weeks    Status On-going    Target Date 11/16/20             PT Long Term Goals - 10/23/20 1344      PT LONG TERM GOAL #1   Title Patient will report being able to sleep and wake up in the morning without pain in hip/leg.    Time 6    Period Weeks    Status On-going      PT LONG TERM GOAL #2   Title Patient will improve on FOTO score to meet predicted outcomes to demonstrate improved functional mobility.    Time 6    Period Weeks    Status On-going      PT LONG TERM GOAL #3   Title Patient will report at least 75% improvement in overall symptoms and/or function to demonstrate improved functional mobility    Time 6    Period Weeks    Status On-going                 Plan - 11/01/20 1008    Clinical Impression Statement Patient instructed in treatment to improve core activation in tandem with RLE movements to improve coordination for dynamic standing activities. Emphasis on lumbo-pelvic activation to improve core stability to facilitate decreased pain during gardening/yard work.    Personal Factors and Comorbidities Comorbidity 1    Comorbidities hx of skin cancer    Examination-Activity Limitations Locomotion Level;Stand;Stairs;Squat;Sleep    Examination-Participation Restrictions Occupation;Yard Work;Community Activity    Stability/Clinical Decision Making  Stable/Uncomplicated    Rehab Potential Good  PT Frequency --   1-2x/week for total of 8 visits over 6 week certification   PT Duration 6 weeks    PT Treatment/Interventions ADLs/Self Care Home Management;Cryotherapy;Electrical Stimulation;Moist Heat;Traction;Balance training;Therapeutic exercise;Therapeutic activities;Functional mobility training;Stair training;Gait training;Neuromuscular re-education;Patient/family education;Manual techniques;Dry needling;Joint Manipulations    PT Next Visit Plan continue strengthening and manual.  possible DN candidate if symptoms don't resolve. STM to glute med    PT Home Exercise Plan lumbar flexion and extension in standing, self mobilziation to right glut med. 12/1: prone hip ext w/ knee flex and TRA contract, quadruped TRA contraction, standing side stretch    Consulted and Agree with Plan of Care Patient           Patient will benefit from skilled therapeutic intervention in order to improve the following deficits and impairments:  Pain, Difficulty walking, Decreased range of motion, Decreased strength, Decreased activity tolerance  Visit Diagnosis: Difficulty in walking, not elsewhere classified  Muscle weakness (generalized)  Pain in right hip  Pain in right foot     Problem List Patient Active Problem List   Diagnosis Date Noted  . BV (bacterial vaginosis) 03/20/2020  . Vaginal odor 03/20/2020  . Vaginal discharge 03/20/2020  . Left breast mass 10/26/2018  . Mastitis 10/26/2018  . Hyperparathyroidism, primary ([place]) 04/07/2018  . Osteopenia 04/07/2018  . Thickened endometrium 03/03/2018  . Cervical high risk human papillomavirus (HPV) DNA test positive 03/02/2018  . PMB (postmenopausal bleeding) 02/19/2018  . Postmenopausal 02/19/2018  . Hypercalcemia 02/19/2018  . Special screening for malignant neoplasms, colon 02/19/2018  . Vitamin D deficiency 02/19/2018  . Fibroids 12/26/2014  . Ovarian cyst 12/26/2014  . Hot flashes  12/15/2014  . Weight gain 12/15/2014  . Irregular periods 12/15/2014  . Peri-menopause 12/15/2014    10:19 AM, 11/01/20 Jerene Pitch, DPT Physical Therapy with [facility]  [phone] office  [place] [address] [place], [place], [address] Phone: [phone]   Fax:  [phone]  Name: Floriene Jeschke MRN: [id number] Date of Birth: [birth year]

## 2020-11-01 NOTE — Patient Instructions (Signed)
Access Code: [email or web address] URL: [email or web address] Date: 11/01/2020 Prepared by: Yetta Glassman  Exercises Prone Hip Extension with Bent Knee - 1 x daily - 7 x weekly - 3 sets - 10 reps - 3 hold Quadruped Transversus Abdominis Bracing - 1 x daily - 7 x weekly - 3 sets - 10 reps - 5 sec hold TL Sidebending Stretch - Single Arm Overhead - 1 x daily - 7 x weekly - 3 sets - 5 reps - 5 hold

## 2020-11-02 ENCOUNTER — Ambulatory Visit (HOSPITAL_COMMUNITY): Payer: 59 | Admitting: Physical Therapy

## 2020-11-02 ENCOUNTER — Encounter (HOSPITAL_COMMUNITY): Payer: Self-pay | Admitting: Physical Therapy

## 2020-11-02 DIAGNOSIS — M79671 Pain in right foot: Secondary | ICD-10-CM

## 2020-11-02 DIAGNOSIS — R262 Difficulty in walking, not elsewhere classified: Secondary | ICD-10-CM

## 2020-11-02 DIAGNOSIS — M6281 Muscle weakness (generalized): Secondary | ICD-10-CM

## 2020-11-02 DIAGNOSIS — M25551 Pain in right hip: Secondary | ICD-10-CM

## 2020-11-02 NOTE — Patient Instructions (Signed)
Access Code: [email or web address] URL: [email or web address] Date: 11/02/2020 Prepared by: Yetta Glassman  Exercises Bent Knee Fallouts - 1 x daily - 7 x weekly - 2 sets - 10 reps - 5 hold Supine Bilateral Hip Internal Rotation Stretch - 1 x daily - 7 x weekly - 2 sets - 10 reps - 5 hold Hip Flexor Stretch at Edge of Bed - 1 x daily - 7 x weekly - 3 sets - 10 reps - 3 hold

## 2020-11-02 NOTE — Therapy (Signed)
[place], [place], [address] Phone: [phone]   Fax:  [phone]  Physical Therapy Treatment  Patient Details  Name: Nancy Barrett MRN: [id number] Date of Birth: [birth year] Referring Provider (PT): Pedro Earls   Encounter Date: 11/02/2020   PT End of Session - 11/02/20 1402    Visit Number 4    Number of Visits 8    Date for PT Re-Evaluation 11/30/20    Authorization Type [employer] no auth, VL 30    Authorization - Visit Number 4    Authorization - Number of Visits 30    PT Start Time 6767    PT Stop Time 1441    PT Time Calculation (min) 38 min    Activity Tolerance Patient tolerated treatment well    Behavior During Therapy [facility] for tasks assessed/performed           Past Medical History:  Diagnosis Date  . Anemia   . Asthma   . Bronchitis   . Cervical high risk human papillomavirus (HPV) DNA test positive 03/02/2018  . Fibroids 12/26/2014  . Headache   . Hot flashes 12/15/2014  . Irregular periods 12/15/2014  . Melanoma ([place])    nose, removed  . Numbness in both hands   . Ovarian cyst 12/26/2014  . Peri-menopause 12/15/2014  . Thickened endometrium 03/03/2018   ?polyp, will get endo bx    Past Surgical History:  Procedure Laterality Date  . COLONOSCOPY N/A 02/08/2019   Procedure: COLONOSCOPY;  Surgeon: Danie Binder, MD;  [facility];  Service: Endoscopy;  Laterality: N/A;  9:30  . PARATHYROIDECTOMY N/A 07/17/2018   Procedure: MINIMALLY INVASIVE PARATHYROIDECTOMY ERAS PATHWAY;  Surgeon: Coralie Keens, MD;  Location: [facility];  Service: General;  Laterality: N/A;  . POLYPECTOMY  02/08/2019   Procedure: POLYPECTOMY;  Surgeon: Danie Binder, MD;  [facility];  Service: Endoscopy;;  . SKIN CANCER EXCISION  2014   removed from nose  . TUBAL LIGATION      There were no vitals filed for this visit.   Subjective Assessment - 11/02/20 1406    Subjective Sore today in  buttocks and in foot but didn't wear correct shoes when she was outside working. Current soreness about 5/10. States that she felt great after last session and is working on the new exercises.    Currently in Pain? Yes    Pain Location Buttocks    Pain Orientation Right    Pain Descriptors / Indicators Sore    Pain Type Chronic pain    Pain Onset More than a month ago              [facility] PT Assessment - 11/02/20 0001      Assessment   Medical Diagnosis right foot and hip pain    Referring Provider (PT) Pedro Earls                         [facility] Adult PT Treatment/Exercise - 11/02/20 0001      Lumbar Exercises: Supine   Other Supine Lumbar Exercises bent knee fall ins and out 2x10 5" holds B    Other Supine Lumbar Exercises [place] stretch nerve glide 3x10 5" holds R       Manual Therapy   Manual Therapy Joint mobilization;Soft tissue mobilization    Manual therapy comments completed seperately from all other skilled interventions    Joint  Mobilization R hip traction - long axis - 6 minutes    Soft tissue mobilization STM to R psoas, illiacus, abdominal obliques - referred pain no leg                  PT Education - 11/02/20 1436    Education Details On anatomy, on rationale for exercises. on HEP    Person(s) Educated Patient    Methods Explanation    Comprehension Verbalized understanding            PT Short Term Goals - 10/23/20 1344      PT SHORT TERM GOAL #1   Title Patient will report at least 50% improvement in overall symptoms and/or function to demonstrate improved functional mobility    Time 4    Period Weeks    Status On-going    Target Date 11/16/20      PT SHORT TERM GOAL #2   Title Patient will be independent in self management strategies to improve quality of life and functional outcomes.    Time 4    Period Weeks    Status On-going    Target Date 11/16/20      PT SHORT TERM GOAL #3   Title Patient will report being able  to walk a mile without difficutly to improve ability to walk at work.    Time 4    Period Weeks    Status On-going    Target Date 11/16/20             PT Long Term Goals - 10/23/20 1344      PT LONG TERM GOAL #1   Title Patient will report being able to sleep and wake up in the morning without pain in hip/leg.    Time 6    Period Weeks    Status On-going      PT LONG TERM GOAL #2   Title Patient will improve on FOTO score to meet predicted outcomes to demonstrate improved functional mobility.    Time 6    Period Weeks    Status On-going      PT LONG TERM GOAL #3   Title Patient will report at least 75% improvement in overall symptoms and/or function to demonstrate improved functional mobility    Time 6    Period Weeks    Status On-going                 Plan - 11/02/20 1445    Clinical Impression Statement Tolerated new exercises well. Right psoas and obliques with referred pain into foot and leg. Added stretches to address this. Reduced symptoms noted end of session. Will follow up with Psoas and quadruped exercises next session.    Personal Factors and Comorbidities Comorbidity 1    Comorbidities hx of skin cancer    Examination-Activity Limitations Locomotion Level;Stand;Stairs;Squat;Sleep    Examination-Participation Restrictions Occupation;Yard Work;Community Activity    Stability/Clinical Decision Making Stable/Uncomplicated    Rehab Potential Good    PT Frequency --   1-2x/week for total of 8 visits over 6 week certification   PT Duration 6 weeks    PT Treatment/Interventions ADLs/Self Care Home Management;Cryotherapy;Electrical Stimulation;Moist Heat;Traction;Balance training;Therapeutic exercise;Therapeutic activities;Functional mobility training;Stair training;Gait training;Neuromuscular re-education;Patient/family education;Manual techniques;Dry needling;Joint Manipulations    PT Next Visit Plan continue strengthening and manual.  possible DN candidate if  symptoms don't resolve. STM to glute med    PT Home Exercise Plan lumbar flexion and extension in standing, self mobilziation to right glut  med. 12/1: prone hip ext w/ knee flex and TRA contract, quadruped TRA contraction, standing side stretch; 12/2 bent knee fall in/outs; [place] stretch nerve glide    Consulted and Agree with Plan of Care Patient           Patient will benefit from skilled therapeutic intervention in order to improve the following deficits and impairments:  Pain, Difficulty walking, Decreased range of motion, Decreased strength, Decreased activity tolerance  Visit Diagnosis: Difficulty in walking, not elsewhere classified  Muscle weakness (generalized)  Pain in right hip  Pain in right foot     Problem List Patient Active Problem List   Diagnosis Date Noted  . BV (bacterial vaginosis) 03/20/2020  . Vaginal odor 03/20/2020  . Vaginal discharge 03/20/2020  . Left breast mass 10/26/2018  . Mastitis 10/26/2018  . Hyperparathyroidism, primary ([place]) 04/07/2018  . Osteopenia 04/07/2018  . Thickened endometrium 03/03/2018  . Cervical high risk human papillomavirus (HPV) DNA test positive 03/02/2018  . PMB (postmenopausal bleeding) 02/19/2018  . Postmenopausal 02/19/2018  . Hypercalcemia 02/19/2018  . Special screening for malignant neoplasms, colon 02/19/2018  . Vitamin D deficiency 02/19/2018  . Fibroids 12/26/2014  . Ovarian cyst 12/26/2014  . Hot flashes 12/15/2014  . Weight gain 12/15/2014  . Irregular periods 12/15/2014  . Peri-menopause 12/15/2014    2:46 PM, 11/02/20 Jerene Pitch, DPT Physical Therapy with [facility]  [phone] office  [place] [address] [place], [place], [address] Phone: [phone]   Fax:  [phone]  Name: Nancy Barrett MRN: [id number] Date of Birth: [birth year]

## 2020-11-07 ENCOUNTER — Encounter (HOSPITAL_COMMUNITY): Payer: Self-pay | Admitting: Physical Therapy

## 2020-11-07 ENCOUNTER — Other Ambulatory Visit: Payer: Self-pay

## 2020-11-07 ENCOUNTER — Ambulatory Visit (HOSPITAL_COMMUNITY): Payer: 59 | Admitting: Physical Therapy

## 2020-11-07 DIAGNOSIS — M25551 Pain in right hip: Secondary | ICD-10-CM

## 2020-11-07 DIAGNOSIS — M6281 Muscle weakness (generalized): Secondary | ICD-10-CM

## 2020-11-07 DIAGNOSIS — M79671 Pain in right foot: Secondary | ICD-10-CM

## 2020-11-07 DIAGNOSIS — R262 Difficulty in walking, not elsewhere classified: Secondary | ICD-10-CM

## 2020-11-07 NOTE — Patient Instructions (Signed)

## 2020-11-07 NOTE — Therapy (Signed)
[place], [place], [address] Phone: [phone]   Fax:  [phone]  Physical Therapy Treatment  Patient Details  Name: Nancy Barrett MRN: [id number] Date of Birth: 22-Sep-1966 Referring Provider (PT): Pedro Earls   Encounter Date: 11/07/2020   PT End of Session - 11/07/20 1453    Visit Number 5    Number of Visits 8    Date for PT Re-Evaluation 11/30/20    Authorization Type [employer] no auth, VL 30    Authorization - Visit Number 5    Authorization - Number of Visits 30    PT Start Time 1607   patient 5 minutes late   PT Stop Time 1529    PT Time Calculation (min) 35 min    Activity Tolerance Patient tolerated treatment well    Behavior During Therapy [facility] for tasks assessed/performed           Past Medical History:  Diagnosis Date  . Anemia   . Asthma   . Bronchitis   . Cervical high risk human papillomavirus (HPV) DNA test positive 03/02/2018  . Fibroids 12/26/2014  . Headache   . Hot flashes 12/15/2014  . Irregular periods 12/15/2014  . Melanoma ([place])    nose, removed  . Numbness in both hands   . Ovarian cyst 12/26/2014  . Peri-menopause 12/15/2014  . Thickened endometrium 03/03/2018   ?polyp, will get endo bx    Past Surgical History:  Procedure Laterality Date  . COLONOSCOPY N/A 02/08/2019   Procedure: COLONOSCOPY;  Surgeon: Danie Binder, MD;  [facility];  Service: Endoscopy;  Laterality: N/A;  9:30  . PARATHYROIDECTOMY N/A 07/17/2018   Procedure: MINIMALLY INVASIVE PARATHYROIDECTOMY ERAS PATHWAY;  Surgeon: Coralie Keens, MD;  Location: [facility];  Service: General;  Laterality: N/A;  . POLYPECTOMY  02/08/2019   Procedure: POLYPECTOMY;  Surgeon: Danie Binder, MD;  [facility];  Service: Endoscopy;;  . SKIN CANCER EXCISION  2014   removed from nose  . TUBAL LIGATION      There were no vitals filed for this visit.   Subjective Assessment - 11/07/20 1452     Subjective Reports difficult time getting up from floor due to weakness in RLE but pain is not limiting activity tolerance or participation and able to perform increased work/leisure activities    Currently in Pain? No/denies    Pain Onset More than a month ago              [facility] PT Assessment - 11/07/20 0001      Flexibility   Soft Tissue Assessment /Muscle Length no                         [facility] Adult PT Treatment/Exercise - 11/07/20 0001      Exercises   Exercises Knee/Hip   surface elevated single leg lunges w/ emphasis on postural control with glute/quad activation     Knee/Hip Exercises: Standing   Other Standing Knee Exercises walking lunges 3x5      Manual Therapy   Manual Therapy Joint mobilization;Soft tissue mobilization    Manual therapy comments completed seperately from all other skilled interventions    Soft tissue mobilization STM to R hamstrings to reduce guarding and referred pain to improve mobility and muscle activation            Trigger Point Dry Needling - 11/07/20 0001  Consent Given? Yes   discussed risks, indications, and contraindications   Education Handout Provided Yes    Muscles Treated Back/Hip Gluteus medius;Gluteus maximus    Dry Needling Comments performed dry needling to glute medius/maximus trigger point                PT Education - 11/07/20 1544    Education Details Patient educated on HEP updates to improve LE flexibility and strength, education in dry needling before and after care    Person(s) Educated Patient    Methods Explanation    Comprehension Verbalized understanding            PT Short Term Goals - 10/23/20 1344      PT SHORT TERM GOAL #1   Title Patient will report at least 50% improvement in overall symptoms and/or function to demonstrate improved functional mobility    Time 4    Period Weeks    Status On-going    Target Date 11/16/20      PT SHORT TERM GOAL #2   Title Patient will be  independent in self management strategies to improve quality of life and functional outcomes.    Time 4    Period Weeks    Status On-going    Target Date 11/16/20      PT SHORT TERM GOAL #3   Title Patient will report being able to walk a mile without difficutly to improve ability to walk at work.    Time 4    Period Weeks    Status On-going    Target Date 11/16/20             PT Long Term Goals - 10/23/20 1344      PT LONG TERM GOAL #1   Title Patient will report being able to sleep and wake up in the morning without pain in hip/leg.    Time 6    Period Weeks    Status On-going      PT LONG TERM GOAL #2   Title Patient will improve on FOTO score to meet predicted outcomes to demonstrate improved functional mobility.    Time 6    Period Weeks    Status On-going      PT LONG TERM GOAL #3   Title Patient will report at least 75% improvement in overall symptoms and/or function to demonstrate improved functional mobility    Time 6    Period Weeks    Status On-going                 Plan - 11/07/20 1520    Clinical Impression Statement Patient tolerated treatment well without adverse effects of mobility activity or dry needling with chief complaint being stiffness in right LE before and after treatment session.  Provided frequent cues for postural attention and activation of right hip/quad during guided exercise. Continued treatment needed for improving RLE strength/flexibility to overcome compensatory strategies from foot and referred LE pain/weakness/guarding    Personal Factors and Comorbidities Comorbidity 1    Comorbidities hx of skin cancer    Examination-Activity Limitations Locomotion Level;Stand;Stairs;Squat;Sleep    Examination-Participation Restrictions Occupation;Yard Work;Community Activity    Stability/Clinical Decision Making Stable/Uncomplicated    Rehab Potential Good    PT Frequency --   1-2x/week for total of 8 visits over 6 week certification   PT  Duration 6 weeks    PT Treatment/Interventions ADLs/Self Care Home Management;Cryotherapy;Electrical Stimulation;Moist Heat;Traction;Balance training;Therapeutic exercise;Therapeutic activities;Functional mobility training;Stair training;Gait training;Neuromuscular re-education;Patient/family education;Manual techniques;Dry needling;Joint  Manipulations    PT Next Visit Plan continue strengthening and manual.  possible DN candidate if symptoms don't resolve. STM to glute med    PT Home Exercise Plan lumbar flexion and extension in standing, self mobilziation to right glut med. 12/1: prone hip ext w/ knee flex and TRA contract, quadruped TRA contraction, standing side stretch; 12/2 bent knee fall in/outs; Purk stretch nerve glide    Consulted and Agree with Plan of Care Patient           Patient will benefit from skilled therapeutic intervention in order to improve the following deficits and impairments:  Pain, Difficulty walking, Decreased range of motion, Decreased strength, Decreased activity tolerance  Visit Diagnosis: Difficulty in walking, not elsewhere classified  Muscle weakness (generalized)  Pain in right hip  Pain in right foot     Problem List Patient Active Problem List   Diagnosis Date Noted  . BV (bacterial vaginosis) 03/20/2020  . Vaginal odor 03/20/2020  . Vaginal discharge 03/20/2020  . Left breast mass 10/26/2018  . Mastitis 10/26/2018  . Hyperparathyroidism, primary ([place]) 04/07/2018  . Osteopenia 04/07/2018  . Thickened endometrium 03/03/2018  . Cervical high risk human papillomavirus (HPV) DNA test positive 03/02/2018  . PMB (postmenopausal bleeding) 02/19/2018  . Postmenopausal 02/19/2018  . Hypercalcemia 02/19/2018  . Special screening for malignant neoplasms, colon 02/19/2018  . Vitamin D deficiency 02/19/2018  . Fibroids 12/26/2014  . Ovarian cyst 12/26/2014  . Hot flashes 12/15/2014  . Weight gain 12/15/2014  . Irregular periods 12/15/2014  .  Peri-menopause 12/15/2014    4:00 PM, 11/07/20 Jerene Pitch, DPT Physical Therapy with [facility]  [phone] office  [place] [address] [place], [place], [address] Phone: [phone]   Fax:  [phone]  Name: Nancy Barrett MRN: [id number] Date of Birth: 08-29-1966

## 2020-11-09 ENCOUNTER — Other Ambulatory Visit: Payer: Self-pay

## 2020-11-09 ENCOUNTER — Encounter (HOSPITAL_COMMUNITY): Payer: Self-pay | Admitting: Physical Therapy

## 2020-11-09 ENCOUNTER — Ambulatory Visit (HOSPITAL_COMMUNITY): Payer: 59 | Admitting: Physical Therapy

## 2020-11-09 DIAGNOSIS — M79671 Pain in right foot: Secondary | ICD-10-CM

## 2020-11-09 DIAGNOSIS — M6281 Muscle weakness (generalized): Secondary | ICD-10-CM

## 2020-11-09 DIAGNOSIS — R262 Difficulty in walking, not elsewhere classified: Secondary | ICD-10-CM | POA: Diagnosis not present

## 2020-11-09 DIAGNOSIS — M25551 Pain in right hip: Secondary | ICD-10-CM

## 2020-11-09 NOTE — Therapy (Signed)
[place], [place], [address] Phone: [phone]   Fax:  [phone]  Physical Therapy Treatment  Patient Details  Name: Nancy Barrett MRN: [id number] Date of Birth: 26-Feb-1966 Referring Provider (PT): Pedro Earls   Encounter Date: 11/09/2020   PT End of Session - 11/09/20 1408    Visit Number 6    Number of Visits 8    Date for PT Re-Evaluation 11/30/20    Authorization Type [employer] no auth, VL 30    Authorization - Visit Number 6    Authorization - Number of Visits 30    PT Start Time 0998    PT Stop Time 1441    PT Time Calculation (min) 39 min    Activity Tolerance Patient tolerated treatment well    Behavior During Therapy [facility] for tasks assessed/performed           Past Medical History:  Diagnosis Date  . Anemia   . Asthma   . Bronchitis   . Cervical high risk human papillomavirus (HPV) DNA test positive 03/02/2018  . Fibroids 12/26/2014  . Headache   . Hot flashes 12/15/2014  . Irregular periods 12/15/2014  . Melanoma ([place])    nose, removed  . Numbness in both hands   . Ovarian cyst 12/26/2014  . Peri-menopause 12/15/2014  . Thickened endometrium 03/03/2018   ?polyp, will get endo bx    Past Surgical History:  Procedure Laterality Date  . COLONOSCOPY N/A 02/08/2019   Procedure: COLONOSCOPY;  Surgeon: Danie Binder, MD;  [facility];  Service: Endoscopy;  Laterality: N/A;  9:30  . PARATHYROIDECTOMY N/A 07/17/2018   Procedure: MINIMALLY INVASIVE PARATHYROIDECTOMY ERAS PATHWAY;  Surgeon: Coralie Keens, MD;  [facility];  Service: General;  Laterality: N/A;  . POLYPECTOMY  02/08/2019   Procedure: POLYPECTOMY;  Surgeon: Danie Binder, MD;  [facility];  Service: Endoscopy;;  . SKIN CANCER EXCISION  2014   removed from nose  . TUBAL LIGATION      There were no vitals filed for this visit.   Subjective Assessment - 11/09/20 1405    Subjective Feeling better  since dry needling at last session. I was able to descend the stairs in reciprocal pattern. 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 Yes    Pain Score 0-No pain    Pain Onset More than a month ago              [facility] PT Assessment - 11/09/20 0001      Assessment   Medical Diagnosis right foot and hip pain    Referring Provider (PT) Pedro Earls                         [facility] Adult PT Treatment/Exercise - 11/09/20 0001      Knee/Hip Exercises: Stretches   Other Knee/Hip Stretches tall kneeling- hip hinge   3x10     Knee/Hip Exercises: Standing   Forward Lunges 3 sets;10 reps   stationary with UE support (split squat position), tactile cues   Other Standing Knee Exercises kneeling lunge position with trunk rotations            Trigger Point Dry Needling - 11/09/20 0001    Consent Given? Yes    Education Handout Provided Yes  Muscles Treated Lower Quadrant Hamstring    Muscles Treated Back/Hip --    Dry Needling Comments performed dry needling to right medial hamstrings to reduce guarding/pain    Hamstring Response Palpable increased muscle length                PT Education - 11/09/20 1415    Education Details patient instructed on activities with emphasis on unilateral focus to improve carryover to work requirements, e.g. kneeling, changing positions from ground to standing to decrease pain and improve functional performance    Person(s) Educated Patient    Methods Explanation;Demonstration    Comprehension Verbalized understanding;Returned demonstration            PT Short Term Goals - 10/23/20 1344      PT SHORT TERM GOAL #1   Title Patient will report at least 50% improvement in overall symptoms and/or function to demonstrate improved functional mobility    Time 4    Period Weeks    Status On-going    Target Date  11/16/20      PT SHORT TERM GOAL #2   Title Patient will be independent in self management strategies to improve quality of life and functional outcomes.    Time 4    Period Weeks    Status On-going    Target Date 11/16/20      PT SHORT TERM GOAL #3   Title Patient will report being able to walk a mile without difficutly to improve ability to walk at work.    Time 4    Period Weeks    Status On-going    Target Date 11/16/20             PT Long Term Goals - 10/23/20 1344      PT LONG TERM GOAL #1   Title Patient will report being able to sleep and wake up in the morning without pain in hip/leg.    Time 6    Period Weeks    Status On-going      PT LONG TERM GOAL #2   Title Patient will improve on FOTO score to meet predicted outcomes to demonstrate improved functional mobility.    Time 6    Period Weeks    Status On-going      PT LONG TERM GOAL #3   Title Patient will report at least 75% improvement in overall symptoms and/or function to demonstrate improved functional mobility    Time 6    Period Weeks    Status On-going                 Plan - 11/09/20 1408    Clinical Impression Statement Patient demonstrates improved activity tolerance and reports increased participation in employment and recreation obligations with a decrease in RLE pain since last treatment session that included dry needling. Continued with dry needling secondary to positive patient response. Less tone noted in right lower extremity during session. Tactile cues required for exercises but good form after initial demonstration.  Treatment plan to continue emphasis on improving single leg balance and strength to normalize gait mechanics and improve ability to squat, bend, and lift for her job duties    Personal Factors and Comorbidities Comorbidity 1    Comorbidities hx of skin cancer    Examination-Activity Limitations Locomotion Level;Stand;Stairs;Squat;Sleep    Examination-Participation  Restrictions Occupation;Yard Work;Community Activity    Stability/Clinical Decision Making Stable/Uncomplicated    Rehab Potential Good    PT Frequency --  1-2x/week for total of 8 visits over 6 week certification   PT Duration 6 weeks    PT Treatment/Interventions ADLs/Self Care Home Management;Cryotherapy;Electrical Stimulation;Moist Heat;Traction;Balance training;Therapeutic exercise;Therapeutic activities;Functional mobility training;Stair training;Gait training;Neuromuscular re-education;Patient/family education;Manual techniques;Dry needling;Joint Manipulations    PT Next Visit Plan continue strengthening and dynamic balance. STM to glute med. Continue with core/hip strengthening    PT Home Exercise Plan lumbar flexion and extension in standing, self mobilziation to right glut med. 12/1: prone hip ext w/ knee flex and TRA contract, quadruped TRA contraction, standing side stretch; 12/2 bent knee fall in/outs; [place] stretch nerve glide. 12/9 kneeling hip hinge, split squat, lunge position with trunk rotation    Consulted and Agree with Plan of Care Patient           Patient will benefit from skilled therapeutic intervention in order to improve the following deficits and impairments:  Pain,Difficulty walking,Decreased range of motion,Decreased strength,Decreased activity tolerance  Visit Diagnosis: Difficulty in walking, not elsewhere classified  Muscle weakness (generalized)  Pain in right hip  Pain in right foot     Problem List Patient Active Problem List   Diagnosis Date Noted  . BV (bacterial vaginosis) 03/20/2020  . Vaginal odor 03/20/2020  . Vaginal discharge 03/20/2020  . Left breast mass 10/26/2018  . Mastitis 10/26/2018  . Hyperparathyroidism, primary ([place]) 04/07/2018  . Osteopenia 04/07/2018  . Thickened endometrium 03/03/2018  . Cervical high risk human papillomavirus (HPV) DNA test positive 03/02/2018  . PMB (postmenopausal bleeding) 02/19/2018  .  Postmenopausal 02/19/2018  . Hypercalcemia 02/19/2018  . Special screening for malignant neoplasms, colon 02/19/2018  . Vitamin D deficiency 02/19/2018  . Fibroids 12/26/2014  . Ovarian cyst 12/26/2014  . Hot flashes 12/15/2014  . Weight gain 12/15/2014  . Irregular periods 12/15/2014  . Peri-menopause 12/15/2014   Documented by: 2:48 PM, 11/09/20 M. Sherlyn Lees, PT, DPT Physical Therapist- [place] Office Number: [phone]   2:49 PM, 11/09/20 Jerene Pitch, PT, DPT Physical Therapist- [place] Office Number: [phone]  [place] [address] [place], [place], [address] Phone: [phone]   Fax:  [phone]  Name: Nancy Barrett MRN: [id number] Date of Birth: [birth year]

## 2020-11-14 ENCOUNTER — Ambulatory Visit (HOSPITAL_COMMUNITY): Payer: 59 | Admitting: Physical Therapy

## 2020-11-16 ENCOUNTER — Encounter (HOSPITAL_COMMUNITY): Payer: Self-pay | Admitting: Physical Therapy

## 2020-11-16 ENCOUNTER — Other Ambulatory Visit: Payer: Self-pay

## 2020-11-16 ENCOUNTER — Ambulatory Visit (HOSPITAL_COMMUNITY): Payer: 59 | Admitting: Physical Therapy

## 2020-11-16 DIAGNOSIS — R262 Difficulty in walking, not elsewhere classified: Secondary | ICD-10-CM | POA: Diagnosis not present

## 2020-11-16 DIAGNOSIS — M25551 Pain in right hip: Secondary | ICD-10-CM

## 2020-11-16 DIAGNOSIS — M6281 Muscle weakness (generalized): Secondary | ICD-10-CM

## 2020-11-16 DIAGNOSIS — M79671 Pain in right foot: Secondary | ICD-10-CM

## 2020-11-16 NOTE — Therapy (Signed)
[facility] [address] [place], [place], [address] Phone: [phone]   Fax:  [phone]  Physical Therapy Treatment and Discharge Summary  Patient Details  Name: Nancy Barrett MRN: [id number] Date of Birth: 02-Nov-1966 Referring Provider (PT): Pedro Earls   PHYSICAL THERAPY DISCHARGE SUMMARY  Visits from Start of Care: 7  Current functional level related to goals / functional outcomes: Patient has met 3/3 STG and LTG with maximum rehab potential met at this time   Remaining deficits: none   Education / Equipment: See below Plan: Patient agrees to discharge.  Patient goals were met. Patient is being discharged due to meeting the stated rehab goals.  ?????        Encounter Date: 11/16/2020   PT End of Session - 11/16/20 1000    Visit Number 7    Number of Visits 8    Date for PT Re-Evaluation 11/30/20    Authorization Type [employer] no auth, VL 30    Authorization - Visit Number 7    Authorization - Number of Visits 30    PT Start Time 1000    PT Stop Time 1040    PT Time Calculation (min) 40 min    Activity Tolerance Patient tolerated treatment well    Behavior During Therapy WFL for tasks assessed/performed           Past Medical History:  Diagnosis Date  . Anemia   . Asthma   . Bronchitis   . Cervical high risk human papillomavirus (HPV) DNA test positive 03/02/2018  . Fibroids 12/26/2014  . Headache   . Hot flashes 12/15/2014  . Irregular periods 12/15/2014  . Melanoma ([place])    nose, removed  . Numbness in both hands   . Ovarian cyst 12/26/2014  . Peri-menopause 12/15/2014  . Thickened endometrium 03/03/2018   ?polyp, will get endo bx    Past Surgical History:  Procedure Laterality Date  . COLONOSCOPY N/A 02/08/2019   Procedure: COLONOSCOPY;  Surgeon: Danie Binder, MD;  [facility];  Service: Endoscopy;  Laterality: N/A;  9:30  . PARATHYROIDECTOMY N/A 07/17/2018   Procedure: MINIMALLY  INVASIVE PARATHYROIDECTOMY ERAS PATHWAY;  Surgeon: Coralie Keens, MD;  Location: [facility];  Service: General;  Laterality: N/A;  . POLYPECTOMY  02/08/2019   Procedure: POLYPECTOMY;  Surgeon: Danie Binder, MD;  [facility];  Service: Endoscopy;;  . SKIN CANCER EXCISION  2014   removed from nose  . TUBAL LIGATION      There were no vitals filed for this visit.   Subjective Assessment - 11/16/20 1027    Subjective Patient reports global improvements in her mobility and activity tolerance with reports of increase in her home/work duties and able to sleep through the  night without pain waking her.    Currently in Pain? No/denies    Pain Score 0-No pain    Pain Descriptors / Indicators Sore    Pain Onset More than a month ago              [facility] PT Assessment - 11/16/20 0001      Assessment   Medical Diagnosis right foot and hip pain    Referring Provider (PT) Pedro Earls      Observation/Other Assessments   Focus on Therapeutic Outcomes (FOTO)  70.5% function  [facility] PT Treatment/Exercise - 11/16/20 0001      Exercises   Exercises Ankle      Ankle Exercises: Seated   Other Seated Ankle Exercises Patient instructed in foot intrinsic strengthening followed by midfoot/rearfoot stretching/mobilization to improve LE weight acceptance            Trigger Point Dry Needling - 11/16/20 0001    Consent Given? Yes    Education Handout Provided Yes    Dry Needling Comments performed dry needling to lateral and plantar aspect right foot    Hamstring Response Palpable increased muscle length;Twitch response elicited                PT Education - 11/16/20 1029    Education Details Patient educated on progression of HEP and addition of foot/ankle mobilization techniques to improve loading response    Person(s) Educated Patient    Methods Explanation;Demonstration    Comprehension Verbalized understanding;Returned  demonstration            PT Short Term Goals - 11/16/20 1020      PT SHORT TERM GOAL #1   Title Patient will report at least 50% improvement in overall symptoms and/or function to demonstrate improved functional mobility    Baseline pt reports 90% improvement    Time 4    Period Weeks    Status Achieved    Target Date 11/16/20      PT SHORT TERM GOAL #2   Title Patient will be independent in self management strategies to improve quality of life and functional outcomes.    Baseline indepdent in managemnt and HEP    Time 4    Period Weeks    Status Achieved    Target Date 11/16/20      PT SHORT TERM GOAL #3   Title Patient will report being able to walk a mile without difficutly to improve ability to walk at work.    Baseline no difficulty    Time 4    Period Weeks    Status Achieved    Target Date 11/16/20             PT Long Term Goals - 11/16/20 1021      PT LONG TERM GOAL #1   Title Patient will report being able to sleep and wake up in the morning without pain in hip/leg.    Baseline not waking her up    Time 6    Period Weeks    Status Achieved      PT LONG TERM GOAL #2   Title Patient will improve on FOTO score to meet predicted outcomes to demonstrate improved functional mobility.    Baseline 70.5%    Time 6    Period Weeks    Status Achieved      PT LONG TERM GOAL #3   Title Patient will report at least 75% improvement in overall symptoms and/or function to demonstrate improved functional mobility    Baseline reports 90% improvement    Time 6    Period Weeks    Status Achieved                 Plan - 11/16/20 1030    Clinical Impression Statement Patient demonstrates global improvements with POC and reports 90% improvement in overall function and well-being.  Patient demonstrates compliance and indepdent performance of HEP and is prepared to D/C to self-management and has met all STG/LTG    Personal Factors and Comorbidities Comorbidity  1     Comorbidities hx of skin cancer    Examination-Activity Limitations Locomotion Level;Stand;Stairs;Squat;Sleep    Examination-Participation Restrictions Occupation;Yard Work;Community Activity    Stability/Clinical Decision Making Stable/Uncomplicated    Rehab Potential Good    PT Frequency --   1-2x/week for total of 8 visits over 6 week certification   PT Duration 6 weeks    PT Treatment/Interventions ADLs/Self Care Home Management;Cryotherapy;Electrical Stimulation;Moist Heat;Traction;Balance training;Therapeutic exercise;Therapeutic activities;Functional mobility training;Stair training;Gait training;Neuromuscular re-education;Patient/family education;Manual techniques;Dry needling;Joint Manipulations    PT Next Visit Plan D/C to HEP    PT Home Exercise Plan lumbar flexion and extension in standing, self mobilziation to right glut med. 12/1: prone hip ext w/ knee flex and TRA contract, quadruped TRA contraction, standing side stretch; 12/2 bent knee fall in/outs; Mccarn stretch nerve glide. 12/9 kneeling hip hinge, split squat, lunge position with trunk rotation    Consulted and Agree with Plan of Care Patient           Patient will benefit from skilled therapeutic intervention in order to improve the following deficits and impairments:  Pain,Difficulty walking,Decreased range of motion,Decreased strength,Decreased activity tolerance  Visit Diagnosis: Difficulty in walking, not elsewhere classified  Muscle weakness (generalized)  Pain in right hip  Pain in right foot     Problem List Patient Active Problem List   Diagnosis Date Noted  . BV (bacterial vaginosis) 03/20/2020  . Vaginal odor 03/20/2020  . Vaginal discharge 03/20/2020  . Left breast mass 10/26/2018  . Mastitis 10/26/2018  . Hyperparathyroidism, primary ([place]) 04/07/2018  . Osteopenia 04/07/2018  . Thickened endometrium 03/03/2018  . Cervical high risk human papillomavirus (HPV) DNA test positive 03/02/2018   . PMB (postmenopausal bleeding) 02/19/2018  . Postmenopausal 02/19/2018  . Hypercalcemia 02/19/2018  . Special screening for malignant neoplasms, colon 02/19/2018  . Vitamin D deficiency 02/19/2018  . Fibroids 12/26/2014  . Ovarian cyst 12/26/2014  . Hot flashes 12/15/2014  . Weight gain 12/15/2014  . Irregular periods 12/15/2014  . Peri-menopause 12/15/2014   Documentation: 10:45 AM, 11/16/20 M. Sherlyn Lees, PT, DPT Physical Therapist- [place] Office Number: [phone]  Treatment: 10:45 AM, 11/16/20 Jerene Pitch, PT, DPT Physical Therapist- [place] Office Number: [phone]  [facility] [address] [place], [place], [address] Phone: [phone]   Fax:  [phone]  Name: Nancy Barrett MRN: [id number] Date of Birth: 1965-12-04

## 2020-11-16 NOTE — Therapy (Signed)
Nancy Barrett, [place], [address] Phone: [phone]   Fax:  [phone]  Physical Therapy Treatment  Patient Details  Name: Nancy Barrett MRN: [id number] Date of Birth: [birth year] Referring Provider (PT): Pedro Earls   Encounter Date: 11/16/2020   PT End of Session - 11/16/20 1000    Visit Number 7    Number of Visits 8    Date for PT Re-Evaluation 11/30/20    Authorization Type [employer] no auth, VL 30    Authorization - Visit Number 7    Authorization - Number of Visits 30    PT Start Time 1000    PT Stop Time 1040    PT Time Calculation (min) 40 min    Activity Tolerance Patient tolerated treatment well    Behavior During Therapy [facility] for tasks assessed/performed           Past Medical History:  Diagnosis Date   Anemia    Asthma    Bronchitis    Cervical high risk human papillomavirus (HPV) DNA test positive 03/02/2018   Fibroids 12/26/2014   Headache    Hot flashes 12/15/2014   Irregular periods 12/15/2014   Melanoma ([place])    nose, removed   Numbness in both hands    Ovarian cyst 12/26/2014   Peri-menopause 12/15/2014   Thickened endometrium 03/03/2018   ?polyp, will get endo bx    Past Surgical History:  Procedure Laterality Date   COLONOSCOPY N/A 02/08/2019   Procedure: COLONOSCOPY;  Surgeon: Danie Binder, MD;  [facility];  Service: Endoscopy;  Laterality: N/A;  9:30   PARATHYROIDECTOMY N/A 07/17/2018   Procedure: MINIMALLY INVASIVE PARATHYROIDECTOMY ERAS PATHWAY;  Surgeon: Coralie Keens, MD;  [facility];  Service: General;  Laterality: N/A;   POLYPECTOMY  02/08/2019   Procedure: POLYPECTOMY;  Surgeon: Danie Binder, MD;  [facility];  Service: Endoscopy;;   SKIN CANCER EXCISION  2014   removed from nose   TUBAL LIGATION      There were no vitals filed for this visit.                                PT Short  Term Goals - 10/23/20 1344      PT SHORT TERM GOAL #1   Title Patient will report at least 50% improvement in overall symptoms and/or function to demonstrate improved functional mobility    Time 4    Period Weeks    Status On-going    Target Date 11/16/20      PT SHORT TERM GOAL #2   Title Patient will be independent in self management strategies to improve quality of life and functional outcomes.    Time 4    Period Weeks    Status On-going    Target Date 11/16/20      PT SHORT TERM GOAL #3   Title Patient will report being able to walk a mile without difficutly to improve ability to walk at work.    Time 4    Period Weeks    Status On-going    Target Date 11/16/20             PT Long Term Goals - 10/23/20 1344      PT LONG TERM GOAL #1   Title Patient will report being able to sleep and wake up in the morning without  pain in hip/leg.    Time 6    Period Weeks    Status On-going      PT LONG TERM GOAL #2   Title Patient will improve on FOTO score to meet predicted outcomes to demonstrate improved functional mobility.    Time 6    Period Weeks    Status On-going      PT LONG TERM GOAL #3   Title Patient will report at least 75% improvement in overall symptoms and/or function to demonstrate improved functional mobility    Time 6    Period Weeks    Status On-going                  Patient will benefit from skilled therapeutic intervention in order to improve the following deficits and impairments:     Visit Diagnosis: Difficulty in walking, not elsewhere classified  Muscle weakness (generalized)  Pain in right hip  Pain in right foot     Problem List Patient Active Problem List   Diagnosis Date Noted   BV (bacterial vaginosis) 03/20/2020   Vaginal odor 03/20/2020   Vaginal discharge 03/20/2020   Left breast mass 10/26/2018   Mastitis 10/26/2018   Hyperparathyroidism, primary ([place]) 04/07/2018   Osteopenia 04/07/2018   Thickened  endometrium 03/03/2018   Cervical high risk human papillomavirus (HPV) DNA test positive 03/02/2018   PMB (postmenopausal bleeding) 02/19/2018   Postmenopausal 02/19/2018   Hypercalcemia 02/19/2018   Special screening for malignant neoplasms, colon 02/19/2018   Vitamin D deficiency 02/19/2018   Fibroids 12/26/2014   Ovarian cyst 12/26/2014   Hot flashes 12/15/2014   Weight gain 12/15/2014   Irregular periods 12/15/2014   Peri-menopause 12/15/2014    Nancy Barrett 11/16/2020, 10:00 AM  [facility] [place], [place], [address] Phone: [phone]   Fax:  [phone]  Name: Nancy Barrett MRN: [id number] Date of Birth: May 11, 1966

## 2020-11-20 ENCOUNTER — Ambulatory Visit
Admission: EM | Admit: 2020-11-20 | Discharge: 2020-11-20 | Disposition: A | Discharge status: Home / Self Care | Payer: 59 | Attending: Urgent Care | Admitting: Urgent Care

## 2020-11-20 ENCOUNTER — Ambulatory Visit (INDEPENDENT_AMBULATORY_CARE_PROVIDER_SITE_OTHER): Payer: 59

## 2020-11-20 ENCOUNTER — Other Ambulatory Visit: Payer: Self-pay

## 2020-11-20 DIAGNOSIS — R509 Fever, unspecified: Secondary | ICD-10-CM

## 2020-11-20 DIAGNOSIS — R059 Cough, unspecified: Secondary | ICD-10-CM

## 2020-11-20 DIAGNOSIS — J101 Influenza due to other identified influenza virus with other respiratory manifestations: Secondary | ICD-10-CM

## 2020-11-20 DIAGNOSIS — J111 Influenza due to unidentified influenza virus with other respiratory manifestations: Secondary | ICD-10-CM | POA: Diagnosis not present

## 2020-11-20 DIAGNOSIS — R52 Pain, unspecified: Secondary | ICD-10-CM

## 2020-11-20 LAB — POCT INFLUENZA A/B
Influenza A, POC: POSITIVE — AB
Influenza B, POC: NEGATIVE

## 2020-11-20 MED ORDER — CETIRIZINE HCL 10 MG PO TABS: 10.0000 mg | ORAL_TABLET | Freq: Every day | ORAL | 0 refills | Status: DC

## 2020-11-20 MED ORDER — BENZONATATE 100 MG PO CAPS: 100.0000 mg | ORAL_CAPSULE | Freq: Three times a day (TID) | ORAL | 0 refills | Status: DC | PRN

## 2020-11-20 MED ORDER — PROMETHAZINE-DM 6.25-15 MG/5ML PO SYRP: 5.0000 mL | ORAL_SOLUTION | Freq: Every evening | ORAL | 0 refills | Status: DC | PRN

## 2020-11-20 MED ORDER — PSEUDOEPHEDRINE HCL 60 MG PO TABS: 60.0000 mg | ORAL_TABLET | Freq: Three times a day (TID) | ORAL | 0 refills | Status: DC | PRN

## 2020-11-20 MED ORDER — ACETAMINOPHEN 325 MG PO TABS
650.0000 mg | ORAL_TABLET | Freq: Once | ORAL | Status: DC
Start: 2020-11-20 — End: 2020-11-20

## 2020-11-20 MED ORDER — OSELTAMIVIR PHOSPHATE 75 MG PO CAPS: 75.0000 mg | ORAL_CAPSULE | Freq: Two times a day (BID) | ORAL | 0 refills | Status: DC

## 2020-11-20 NOTE — ED Provider Notes (Signed)
[place]   MRN: [id number] DOB: Apr 18, 1966  Subjective:   Nancy Barrett is a 54 y.o. female presenting for 2-day history of acute onset fever and body aches, mild cough.  Patient had exposure to influenza through her grandchildren.  Has had 2 negative Covid tests.  Denies chest pain, shortness of breath.  Denies history of asthma, respiratory disorders.  She is not a smoker.  No current facility-administered medications for this encounter.  Current Outpatient Medications:    acetaminophen (TYLENOL) 500 MG tablet, Take 1,000 mg by mouth every 6 (six) hours as needed (for pain.)., Disp: , Rfl:    Cholecalciferol (VITAMIN D-3) 125 MCG (5000 UT) TABS, Take 5,000 Units by mouth daily., Disp: , Rfl:    meloxicam (MOBIC) 7.5 MG tablet, Take 1 tablet (7.5 mg total) by mouth 2 (two) times daily., Disp: 30 tablet, Rfl: 5   metroNIDAZOLE (FLAGYL) 500 MG tablet, Take 1 tablet (500 mg total) by mouth 2 (two) times daily., Disp: 14 tablet, Rfl: 0   Multiple Vitamin (MULTIVITAMIN WITH MINERALS) TABS tablet, Take 1 tablet by mouth daily., Disp: , Rfl:    Allergies  Allergen Reactions   Sulfa Antibiotics    Amoxicillin Hives    Has patient had a PCN reaction causing immediate rash, facial/tongue/throat swelling, SOB or lightheadedness with hypotension: Unknown Has patient had a PCN reaction causing severe rash involving mucus membranes or skin necrosis: Unknown Has patient had a PCN reaction that required hospitalization: No Has patient had a PCN reaction occurring within the last 10 years: No If all of the above answers are "NO", then may proceed with Cephalosporin use.     Penicillins Hives    Has patient had a PCN reaction causing immediate rash, facial/tongue/throat swelling, SOB or lightheadedness with hypotension: Unknown Has patient had a PCN reaction causing severe rash involving mucus membranes or skin necrosis: Unknown Has patient had a PCN reaction that required  hospitalization: No Has patient had a PCN reaction occurring within the last 10 years: No If all of the above answers are "NO", then may proceed with Cephalosporin use.     Past Medical History:  Diagnosis Date   Anemia    Asthma    Bronchitis    Cervical high risk human papillomavirus (HPV) DNA test positive 03/02/2018   Fibroids 12/26/2014   Headache    Hot flashes 12/15/2014   Irregular periods 12/15/2014   Melanoma ([place])    nose, removed   Numbness in both hands    Ovarian cyst 12/26/2014   Peri-menopause 12/15/2014   Thickened endometrium 03/03/2018   ?polyp, will get endo bx     Past Surgical History:  Procedure Laterality Date   COLONOSCOPY N/A 02/08/2019   Procedure: COLONOSCOPY;  Surgeon: Danie Binder, MD;  [facility];  Service: Endoscopy;  Laterality: N/A;  9:30   PARATHYROIDECTOMY N/A 07/17/2018   Procedure: MINIMALLY INVASIVE PARATHYROIDECTOMY ERAS PATHWAY;  Surgeon: Coralie Keens, MD;  [facility];  Service: General;  Laterality: N/A;   POLYPECTOMY  02/08/2019   Procedure: POLYPECTOMY;  Surgeon: Danie Binder, MD;  [facility];  Service: Endoscopy;;   SKIN CANCER EXCISION  2014   removed from nose   TUBAL LIGATION      Family History  Problem Relation Age of Onset   Bronchitis Mother    Arthritis Mother    Hypertension Mother    Hyperlipidemia Mother    Heart attack Father    Heart attack Paternal  Uncle    Other Maternal Grandfather        stomach problems   Cancer Maternal Grandfather        colon cancer   Heart attack Paternal Grandmother    Stroke Paternal Grandfather    Hypertension Brother    Hypertension Brother    Hypertension Brother    Cancer Maternal Uncle        colon cancer   Colon cancer Maternal Uncle    Colon cancer Maternal Great-grandfather    Colon polyps Neg Hx     Social History   Tobacco Use   Smoking status: Never Smoker   Smokeless tobacco: Never Used   Vaping Use   Vaping Use: Never used  Substance Use Topics   Alcohol use: No   Drug use: No    ROS   Objective:   Vitals: BP 130/83    Pulse (!) 111    Temp (!) 100.5 F (38.1 C)    Resp 20    LMP 05/11/2018 (Exact Date)    SpO2 94%   Physical Exam Constitutional:      General: She is not in acute distress.    Appearance: Normal appearance. She is well-developed. She is not ill-appearing, toxic-appearing or diaphoretic.  HENT:     Head: Normocephalic and atraumatic.     Nose: Nose normal.     Mouth/Throat:     Mouth: Mucous membranes are moist.  Eyes:     Extraocular Movements: Extraocular movements intact.     Pupils: Pupils are equal, round, and reactive to light.  Cardiovascular:     Rate and Rhythm: Normal rate and regular rhythm.     Pulses: Normal pulses.     Heart sounds: Normal heart sounds. No murmur heard. No friction rub. No gallop.   Pulmonary:     Effort: Pulmonary effort is normal. No respiratory distress.     Breath sounds: No stridor. No wheezing, rhonchi or rales.     Comments: Slightly decreased lung sounds in mid to lower lung fields. Skin:    General: Skin is warm and dry.     Findings: No rash.  Neurological:     Mental Status: She is alert and oriented to person, place, and time.  Psychiatric:        Mood and Affect: Mood normal.        Behavior: Behavior normal.        Thought Content: Thought content normal.     Results for orders placed or performed during the hospital encounter of 11/20/20 (from the past 24 hour(s))  POCT Influenza A/B     Status: Abnormal   Collection Time: 11/20/20  3:31 PM  Result Value Ref Range   Influenza A, POC Positive (A) Negative   Influenza B, POC Negative Negative   DG Chest 2 View  Result Date: 11/20/2020 CLINICAL DATA:  Cough, fever, flu EXAM: CHEST - 2 VIEW COMPARISON:  10/25/2015 FINDINGS: Lungs are clear.  No pleural effusion or pneumothorax. The heart is normal in size. Mild sclerosis in the  left proximal humerus, favoring sequela prior bone infarct, chronic. IMPRESSION: Normal chest radiographs. Electronically Signed   By: Julian Hy M.D.   On: 11/20/2020 15:59    Assessment and Plan :   PDMP not reviewed this encounter.  1. Influenza A   2. Fever, unspecified   3. Body aches     We will manage for influenza with Tamiflu, supportive care.  Chest x-ray was negative.  She was given Tylenol in clinic. Counseled patient on potential for adverse effects with medications prescribed/recommended today, ER and return-to-clinic precautions discussed, patient verbalized understanding.    Jaynee Eagles, [place] 11/20/20 1605

## 2020-11-20 NOTE — ED Triage Notes (Signed)
Pt presents with body aches and fever with some cough that started Saturday

## 2020-11-20 NOTE — ED Triage Notes (Signed)
2 negative covid test

## 2020-11-21 ENCOUNTER — Ambulatory Visit (HOSPITAL_COMMUNITY): Payer: 59 | Admitting: Physical Therapy

## 2020-11-23 ENCOUNTER — Ambulatory Visit (HOSPITAL_COMMUNITY): Payer: 59 | Admitting: Physical Therapy

## 2020-11-29 ENCOUNTER — Encounter (HOSPITAL_COMMUNITY): Payer: 59

## 2020-11-30 ENCOUNTER — Encounter (HOSPITAL_COMMUNITY): Payer: 59

## 2022-02-07 ENCOUNTER — Encounter: Payer: Self-pay | Admitting: *Deleted

## 2022-05-30 ENCOUNTER — Other Ambulatory Visit: Payer: Self-pay | Admitting: Internal Medicine

## 2022-05-30 DIAGNOSIS — Z1231 Encounter for screening mammogram for malignant neoplasm of breast: Secondary | ICD-10-CM

## 2022-06-10 ENCOUNTER — Encounter: Payer: Self-pay | Admitting: Gastroenterology

## 2022-06-10 NOTE — Progress Notes (Unsigned)
Cardiology Office Note:   Date:  06/11/2022  NAME:  Nancy Barrett    MRN: [id number] DOB:  06-Jun-1966   PCP:  [place] Nation, MD  Cardiologist:  None  Electrophysiologist:  None   Referring MD: [place] Nation, MD   Chief Complaint  Patient presents with   Chest Pain    History of Present Illness:   Nancy Barrett is a 56 y.o. female with a hx of asthma who is being seen today for the evaluation of chest pain at the request of [place] Nation, MD. she reports for the last 1 month has had intermittent chest tightness.  She reports a fluttering in her chest when she exerts herself.  Symptoms can also occur when she lays down at night.  She reports symptoms may not occur with the same level of exertion on some days.  They can occur every few days.  She also reports tightness in her neck.  She reports this bothers her.  Her father had a heart attack at age 73 so this does bother her.  She really does not see physicians and was recently seen by a new primary care physician.  Total cholesterol 243, HDL 78, LDL 158, triglycerides 46.  High HDL cholesterol is protective.  EKG demonstrates sinus rhythm with no acute ischemic changes or evidence of infarction.  Her CV exam is normal.  She does not smoke.  No alcohol or drug use.  No shortness of breath with this.  Recent kidney profile from primary care physician demonstrates a creatinine 0.79.  She personally has never had a heart attack or stroke.  She is married.  She has 3 children.  She has several grandchildren.  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  She reports that she does a lot of strenuous activity.  She does report some stress in her life which could be contributing.  Her blood pressure is normal.  Symptoms are ongoing.  We discussed coronary CTA and echo.  She is willing to proceed with both.  Past Medical History: Past Medical History:  Diagnosis Date   Anemia    Asthma    Bronchitis    Cervical high risk human papillomavirus  (HPV) DNA test positive 03/02/2018   Fibroids 12/26/2014   Headache    Hot flashes 12/15/2014   Irregular periods 12/15/2014   Melanoma ([place])    nose, removed   Numbness in both hands    Ovarian cyst 12/26/2014   Peri-menopause 12/15/2014   Thickened endometrium 03/03/2018   ?polyp, will get endo bx    Past Surgical History: Past Surgical History:  Procedure Laterality Date   COLONOSCOPY N/A 02/08/2019   Procedure: COLONOSCOPY;  Surgeon: Danie Binder, MD;  Location: [facility];  Service: Endoscopy;  Laterality: N/A;  9:30   PARATHYROIDECTOMY N/A 07/17/2018   Procedure: MINIMALLY INVASIVE PARATHYROIDECTOMY ERAS PATHWAY;  Surgeon: Coralie Keens, MD;  Location: [facility];  Service: General;  Laterality: N/A;   POLYPECTOMY  02/08/2019   Procedure: POLYPECTOMY;  Surgeon: Danie Binder, MD;  Location: [facility];  Service: Endoscopy;;   SKIN CANCER EXCISION  2014   removed from nose   TUBAL LIGATION      Current Medications: Current Meds  Medication Sig   acetaminophen (TYLENOL) 500 MG tablet Take 1,000 mg by mouth every 6 (six) hours as needed (for pain.).   cetirizine (ZYRTEC ALLERGY) 10 MG tablet Take 1 tablet (10 mg total) by mouth daily.   Cholecalciferol (VITAMIN  D-3) 125 MCG (5000 UT) TABS Take 5,000 Units by mouth daily.   metoprolol tartrate (LOPRESSOR) 100 MG tablet Take 1 tablet by mouth once for procedure.   Multiple Vitamin (MULTIVITAMIN WITH MINERALS) TABS tablet Take 1 tablet by mouth daily.   rosuvastatin (CRESTOR) 10 MG tablet Take 10 mg by mouth at bedtime.     Allergies:    Sulfa antibiotics, Amoxicillin, and Penicillins   Social History: Social History   Socioeconomic History   Marital status: Married    Spouse name: Not on file   Number of children: 3   Years of education: Not on file   Highest education level: Not on file  Occupational History   Occupation: Nursery Owner  Tobacco Use   Smoking status: Never   Smokeless tobacco: Never  Vaping Use    Vaping Use: Never used  Substance and Sexual Activity   Alcohol use: No   Drug use: No   Sexual activity: Yes    Birth control/protection: Surgical    Comment: tubal  Other Topics Concern   Not on file  Social History Narrative   WATCHES 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   Social Determinants of Health   Financial Resource Strain: Not on file  Food Insecurity: Not on file  Transportation Needs: Not on file  Physical Activity: Not on file  Stress: Not on file  Social Connections: Not on file     Family History: The patient's family history includes Arthritis in her mother; Bronchitis in her mother; Cancer in her maternal grandfather and maternal uncle; Colon cancer in her maternal great-grandfather and maternal uncle; Heart attack in her paternal grandmother and paternal uncle; Heart attack (age of onset: 83) in her father; Hyperlipidemia in her mother; Hypertension in her brother, brother, brother, and mother; Other in her maternal grandfather; Stroke in her paternal grandfather. There is no history of Colon polyps.  ROS:   All other ROS reviewed and negative. Pertinent positives noted in the HPI.     EKGs/Labs/Other Studies Reviewed:   The following studies were personally reviewed by me today:  EKG:  EKG is ordered today.  The ekg ordered today demonstrates normal sinus rhythm heart rate 68, no acute ischemic changes or evidence of infarction, and was personally reviewed by me.   Recent Labs: No results found for requested labs within last 365 days.   Recent Lipid Panel    Component Value Date/Time   CHOL 191 02/13/2018 0857   TRIG 37 02/13/2018 0857   HDL 75 02/13/2018 0857   CHOLHDL 2.5 02/13/2018 0857   CHOLHDL 3.2 12/15/2014 1635   VLDL 11 12/15/2014 1635   LDLCALC 109 (H) 02/13/2018 0857    Physical Exam:   VS:  BP 136/84   Pulse 68   Ht '5\' 5"'$  (1.651 m)   Wt 146 lb (66.2 kg)   LMP 12/15/2017   SpO2 98%   BMI 24.30 kg/m     Wt Readings from Last 3 Encounters:  06/11/22 146 lb (66.2 kg)  03/20/20 154 lb (69.9 kg)  09/10/19 147 lb (66.7 kg)    General: Well nourished, well developed, in no acute distress Head: Atraumatic, normal size  Eyes: PEERLA, EOMI  Neck: Supple, no JVD Endocrine: No thryomegaly Cardiac: Normal S1, S2; RRR; no murmurs, rubs, or gallops Lungs: Clear to auscultation bilaterally, no wheezing, rhonchi or rales  Abd: Soft, nontender, no hepatomegaly  Ext: No edema, pulses 2+ Musculoskeletal: No deformities, BUE and BLE strength normal and  equal Skin: Warm and dry, no rashes   Neuro: Alert and oriented to person, place, time, and situation, CNII-XII grossly intact, no focal deficits  Psych: Normal mood and affect   ASSESSMENT:   Nancy Barrett is a 56 y.o. female who presents for the following: 1. Precordial pain   2. Mixed hyperlipidemia     PLAN:   1. Precordial pain 2. Mixed hyperlipidemia -Exertional chest tightness.  Can also occur when she is not exerting herself.  Symptoms do resolve with cessation of activity.  Could represent stable angina.  She does have good cholesterol that is high.  LDL is high as well.  CV exam is normal.  Given strong family history would recommend coronary CTA to evaluate this.  100 mg of metoprolol tartrate.  Serum creatinine through primary care office was 0.79.  Does not need to be repeated.  We will also obtain an echocardiogram.  She will see me back as needed based on the results of her scan.  Symptoms could also just be acid reflux and/or musculoskeletal.  She does strenuous work in a [employer].  Disposition: Return if symptoms worsen or fail to improve.  Medication Adjustments/Labs and Tests Ordered: Current medicines are reviewed at length with the patient today.  Concerns regarding medicines are outlined above.  Orders Placed This Encounter  Procedures   CT CORONARY MORPH W/CTA COR W/SCORE W/CA W/CM &/OR WO/CM   EKG 12-Lead    ECHOCARDIOGRAM COMPLETE   Meds ordered this encounter  Medications   metoprolol tartrate (LOPRESSOR) 100 MG tablet    Sig: Take 1 tablet by mouth once for procedure.    Dispense:  1 tablet    Refill:  0    Patient Instructions  Medication Instructions:  Take Metoprolol 100 mg two hours before CT when scheduled.   *If you need a refill on your cardiac medications before your next appointment, please call your pharmacy*   Testing/Procedures: Your physician has requested that you have cardiac CT. Cardiac computed tomography (CT) is a painless test that uses an x-ray machine to take clear, detailed pictures of your heart. For further information please visit HugeFiesta.[email or web address]. Please follow instruction sheet as given.  Echocardiogram - Your physician has requested that you have an echocardiogram. Echocardiography is a painless test that uses sound waves to create images of your heart. It provides your doctor with information about the size and shape of your heart and how well your heart's chambers and valves are working. This procedure takes approximately one hour. There are no restrictions for this procedure.    Follow-Up: At [facility], you and your health needs are our priority.  As part of our continuing mission to provide you with exceptional heart care, we have created designated Provider Care Teams.  These Care Teams include your primary Cardiologist (physician) and Advanced Practice Providers (APPs -  Physician Assistants and Nurse Practitioners) who all work together to provide you with the care you need, when you need it.  We recommend signing up for the patient portal called "MyChart".  Sign up information is provided on this After Visit Summary.  MyChart is used to connect with patients for Virtual Visits (Telemedicine).  Patients are able to view lab/test results, encounter notes, upcoming appointments, etc.  Non-urgent messages can be sent to your provider as well.   To  learn more about what you can do with MyChart, go to [email or web address].    Your next appointment:   As needed  The format  for your next appointment:   In Person  Provider:   Eleonore Chiquito, MD    Other Instructions   Your cardiac CT will be scheduled at one of the below locations:   [facility] [address] [place], [place] [address] [phone]  If scheduled at [facility], please arrive at the [facility] and Children's Entrance (Entrance C2) of [facility] 30 minutes prior to test start time. You can use the FREE valet parking offered at entrance C (encouraged to control the heart rate for the test)  Proceed to the [facility] Radiology Department (first floor) to check-in and test prep.  All radiology patients and guests should use entrance C2 at [facility], accessed from [facility], even though the hospital's physical address listed is [address]     Please follow these instructions carefully (unless otherwise directed):  On the Night Before the Test: Be sure to Drink plenty of water. Do not consume any caffeinated/decaffeinated beverages or chocolate 12 hours prior to your test. Do not take any antihistamines 12 hours prior to your test.  On the Day of the Test: Drink plenty of water until 1 hour prior to the test. Do not eat any food 4 hours prior to the test. You may take your regular medications prior to the test.  Take metoprolol (Lopressor) two hours prior to test. HOLD Furosemide/Hydrochlorothiazide morning of the test. FEMALES- please wear underwire-free bra if available, avoid dresses & tight clothing  After the Test: Drink plenty of water. After receiving IV contrast, you may experience a mild flushed feeling. This is normal. On occasion, you may experience a mild rash up to 24 hours after the test. This is not dangerous. If this occurs, you can take Benadryl 25 mg and increase your  fluid intake. If you experience trouble breathing, this can be serious. If it is severe call 911 IMMEDIATELY. If it is mild, please call our office. If you take any of these medications: Glipizide/Metformin, Avandament, Glucavance, please do not take 48 hours after completing test unless otherwise instructed.  We will call to schedule your test 2-4 weeks out understanding that some insurance companies will need an authorization prior to the service being performed.   For non-scheduling related questions, please contact the cardiac imaging nurse navigator should you have any questions/concerns: Marchia Bond, Cardiac Imaging Nurse Navigator Gordy Clement, Cardiac Imaging Nurse Navigator [facility] Direct Office Dial: [phone]   For scheduling needs, including cancellations and rescheduling, please call [place], [phone].           Signed, Addison Naegeli. Audie Box, MD, [place]  [address] [place],  [address] [phone]  06/11/2022 9:06 AM

## 2022-06-11 ENCOUNTER — Encounter: Payer: Self-pay | Admitting: Cardiovascular Disease

## 2022-06-11 ENCOUNTER — Ambulatory Visit (INDEPENDENT_AMBULATORY_CARE_PROVIDER_SITE_OTHER): Payer: Self-pay | Admitting: Cardiovascular Disease

## 2022-06-11 VITALS — BP 136/84 | HR 68 | Ht 65.0 in | Wt 146.0 lb

## 2022-06-11 DIAGNOSIS — E782 Mixed hyperlipidemia: Secondary | ICD-10-CM

## 2022-06-11 DIAGNOSIS — R072 Precordial pain: Secondary | ICD-10-CM

## 2022-06-11 MED ORDER — METOPROLOL TARTRATE 100 MG PO TABS: ORAL_TABLET | ORAL | 0 refills | Status: DC

## 2022-06-11 NOTE — Patient Instructions (Signed)
Medication Instructions:  Take Metoprolol 100 mg two hours before CT when scheduled.   *If you need a refill on your cardiac medications before your next appointment, please call your pharmacy*   Testing/Procedures: Your physician has requested that you have cardiac CT. Cardiac computed tomography (CT) is a painless test that uses an x-ray machine to take clear, detailed pictures of your heart. For further information please visit HugeFiesta.[email or web address]. Please follow instruction sheet as given.  Echocardiogram - Your physician has requested that you have an echocardiogram. Echocardiography is a painless test that uses sound waves to create images of your heart. It provides your doctor with information about the size and shape of your heart and how well your heart's chambers and valves are working. This procedure takes approximately one hour. There are no restrictions for this procedure.    Follow-Up: At [facility], you and your health needs are our priority.  As part of our continuing mission to provide you with exceptional heart care, we have created designated Provider Care Teams.  These Care Teams include your primary Cardiologist (physician) and Advanced Practice Providers (APPs -  Physician Assistants and Nurse Practitioners) who all work together to provide you with the care you need, when you need it.  We recommend signing up for the patient portal called "MyChart".  Sign up information is provided on this After Visit Summary.  MyChart is used to connect with patients for Virtual Visits (Telemedicine).  Patients are able to view lab/test results, encounter notes, upcoming appointments, etc.  Non-urgent messages can be sent to your provider as well.   To learn more about what you can do with MyChart, go to [email or web address].    Your next appointment:   As needed  The format for your next appointment:   In Person  Provider:   Eleonore Chiquito, MD    Other Instructions   Your  cardiac CT will be scheduled at one of the below locations:   [facility] [address] [place], [place] [address] [phone]  If scheduled at [facility], please arrive at the [facility] and Children's Entrance (Entrance C2) of [facility] 30 minutes prior to test start time. You can use the FREE valet parking offered at entrance C (encouraged to control the heart rate for the test)  Proceed to the [facility] Radiology Department (first floor) to check-in and test prep.  All radiology patients and guests should use entrance C2 at [facility], accessed from [facility], even though the hospital's physical address listed is [address]     Please follow these instructions carefully (unless otherwise directed):  On the Night Before the Test: Be sure to Drink plenty of water. Do not consume any caffeinated/decaffeinated beverages or chocolate 12 hours prior to your test. Do not take any antihistamines 12 hours prior to your test.  On the Day of the Test: Drink plenty of water until 1 hour prior to the test. Do not eat any food 4 hours prior to the test. You may take your regular medications prior to the test.  Take metoprolol (Lopressor) two hours prior to test. HOLD Furosemide/Hydrochlorothiazide morning of the test. FEMALES- please wear underwire-free bra if available, avoid dresses & tight clothing  After the Test: Drink plenty of water. After receiving IV contrast, you may experience a mild flushed feeling. This is normal. On occasion, you may experience a mild rash up to 24 hours after the test. This is not  dangerous. If this occurs, you can take Benadryl 25 mg and increase your fluid intake. If you experience trouble breathing, this can be serious. If it is severe call 911 IMMEDIATELY. If it is mild, please call our office. If you take any of these medications: Glipizide/Metformin, Avandament, Glucavance, please do  not take 48 hours after completing test unless otherwise instructed.  We will call to schedule your test 2-4 weeks out understanding that some insurance companies will need an authorization prior to the service being performed.   For non-scheduling related questions, please contact the cardiac imaging nurse navigator should you have any questions/concerns: Marchia Bond, Cardiac Imaging Nurse Navigator Gordy Clement, Cardiac Imaging Nurse Navigator [facility] Direct Office Dial: [phone]   For scheduling needs, including cancellations and rescheduling, please call [place], [phone].

## 2022-06-16 ENCOUNTER — Encounter (HOSPITAL_COMMUNITY): Payer: Self-pay | Admitting: Emergency Medicine

## 2022-06-16 ENCOUNTER — Other Ambulatory Visit: Payer: Self-pay

## 2022-06-16 ENCOUNTER — Emergency Department (HOSPITAL_COMMUNITY)
Admission: EM | Admit: 2022-06-16 | Discharge: 2022-06-16 | Disposition: A | Discharge status: Home / Self Care | Payer: PRIVATE HEALTH INSURANCE | Attending: Emergency Medicine | Admitting: Emergency Medicine

## 2022-06-16 DIAGNOSIS — T63441A Toxic effect of venom of bees, accidental (unintentional), initial encounter: Secondary | ICD-10-CM | POA: Diagnosis not present

## 2022-06-16 DIAGNOSIS — T7840XA Allergy, unspecified, initial encounter: Secondary | ICD-10-CM | POA: Diagnosis present

## 2022-06-16 MED ORDER — EPINEPHRINE 0.3 MG/0.3ML IJ SOAJ: 0.3000 mg | INTRAMUSCULAR | 0 refills | Status: AC | PRN

## 2022-06-16 MED ORDER — FAMOTIDINE IN NACL 20-0.9 MG/50ML-% IV SOLN
20.0000 mg | Freq: Once | INTRAVENOUS | Status: AC
  Administered 2022-06-16: 20 mg via INTRAVENOUS
  Filled 2022-06-16: qty 50

## 2022-06-16 MED ORDER — METHYLPREDNISOLONE 4 MG PO TBPK: ORAL_TABLET | ORAL | 0 refills | Status: DC

## 2022-06-16 MED ORDER — SODIUM CHLORIDE 0.9 % IV BOLUS
500.0000 mL | Freq: Once | INTRAVENOUS | Status: AC
  Administered 2022-06-16: 500 mL via INTRAVENOUS

## 2022-06-16 MED ORDER — DEXAMETHASONE SODIUM PHOSPHATE 10 MG/ML IJ SOLN
10.0000 mg | Freq: Once | INTRAMUSCULAR | Status: AC
  Administered 2022-06-16: 10 mg via INTRAVENOUS
  Filled 2022-06-16: qty 1

## 2022-06-16 MED ORDER — DIPHENHYDRAMINE HCL 50 MG/ML IJ SOLN
50.0000 mg | Freq: Once | INTRAMUSCULAR | Status: AC
  Administered 2022-06-16: 50 mg via INTRAVENOUS
  Filled 2022-06-16: qty 1

## 2022-06-16 NOTE — ED Provider Notes (Signed)
[facility] EMERGENCY DEPARTMENT Provider Note   CSN: [id number] Arrival date & time: 06/16/22  0257     History  Chief Complaint  Patient presents with   Allergic Reaction    Nancy Barrett is a 56 y.o. female.  Patient presents to the emergency department for evaluation of allergic reaction to bee sting.  Patient reports that she was stung by a bee on her foot yesterday.  She did take a single dose of Benadryl after being stung.  Tonight she has started to itch all over and has noticed a diffuse rash.  She has nausea but no vomiting.  No abdominal pain.  No tongue or throat swelling.  No difficulty breathing.  She reports that she did have allergic reactions to bee stings when she was a kid, but has been stung as an adult without problems.       Home Medications Prior to Admission medications   Medication Sig Start Date End Date Taking? Authorizing Provider  EPINEPHrine 0.3 mg/0.3 mL IJ SOAJ injection Inject 0.3 mg into the muscle as needed for anaphylaxis. 06/16/22  Yes Aleni Andrus, Canary Brim, MD  methylPREDNISolone (MEDROL DOSEPAK) 4 MG TBPK tablet As directed 06/16/22  Yes Jewelene Mairena, Canary Brim, MD  acetaminophen (TYLENOL) 500 MG tablet Take 1,000 mg by mouth every 6 (six) hours as needed (for pain.).    [provider]  cetirizine (ZYRTEC ALLERGY) 10 MG tablet Take 1 tablet (10 mg total) by mouth daily. 11/20/20   Wallis Bamberg, PA-C  Cholecalciferol (VITAMIN D-3) 125 MCG (5000 UT) TABS Take 5,000 Units by mouth daily.    [provider]  metoprolol tartrate (LOPRESSOR) 100 MG tablet Take 1 tablet by mouth once for procedure. 06/11/22   O'NealRonnald Ramp, MD  Multiple Vitamin (MULTIVITAMIN WITH MINERALS) TABS tablet Take 1 tablet by mouth daily.    [provider]  rosuvastatin (CRESTOR) 10 MG tablet Take 10 mg by mouth at bedtime. 06/10/22   [provider]      Allergies    Sulfa antibiotics, Amoxicillin, and Penicillins    Review of  Systems   Review of Systems  Physical Exam Updated Vital Signs BP (!) 159/89 (BP Location: Right Arm)   Pulse 87   Temp 97.8 F (36.6 C) (Oral)   Resp 18   Ht 5\' 5"  (1.651 m)   Wt 66 kg   LMP 12/15/2017   SpO2 97%   BMI 24.21 kg/m  Physical Exam Vitals and nursing note reviewed.  Constitutional:      General: She is not in acute distress.    Appearance: She is well-developed.  HENT:     Head: Normocephalic and atraumatic.     Mouth/Throat:     Mouth: Mucous membranes are moist.  Eyes:     General: Vision grossly intact. Gaze aligned appropriately.     Extraocular Movements: Extraocular movements intact.     Conjunctiva/sclera: Conjunctivae normal.  Cardiovascular:     Rate and Rhythm: Normal rate and regular rhythm.     Pulses: Normal pulses.     Heart sounds: Normal heart sounds, S1 normal and S2 normal. No murmur heard.    No friction rub. No gallop.  Pulmonary:     Effort: Pulmonary effort is normal. No respiratory distress.     Breath sounds: Normal breath sounds.  Abdominal:     General: Bowel sounds are normal.     Palpations: Abdomen is soft.     Tenderness: There is no abdominal tenderness.  There is no guarding or rebound.     Hernia: No hernia is present.  Musculoskeletal:        General: No swelling.     Cervical back: Full passive range of motion without pain, normal range of motion and neck supple. No spinous process tenderness or muscular tenderness. Normal range of motion.     Right lower leg: No edema.     Left lower leg: No edema.  Skin:    General: Skin is warm and dry.     Capillary Refill: Capillary refill takes less than 2 seconds.     Findings: Rash present. No ecchymosis, erythema or wound.  Neurological:     General: No focal deficit present.     Mental Status: She is alert and oriented to person, place, and time.     GCS: GCS eye subscore is 4. GCS verbal subscore is 5. GCS motor subscore is 6.     Cranial Nerves: Cranial nerves 2-12 are  intact.     Sensory: Sensation is intact.     Motor: Motor function is intact.     Coordination: Coordination is intact.  Psychiatric:        Attention and Perception: Attention normal.        Mood and Affect: Mood normal.        Speech: Speech normal.        Behavior: Behavior normal.     ED Results / Procedures / Treatments   Labs (all labs ordered are listed, but only abnormal results are displayed) Labs Reviewed - No data to display  EKG None  Radiology No results found.  Procedures Procedures    Medications Ordered in ED Medications  sodium chloride 0.9 % bolus 500 mL (has no administration in time range)  dexamethasone (DECADRON) injection 10 mg (has no administration in time range)  diphenhydrAMINE (BENADRYL) injection 50 mg (has no administration in time range)  famotidine (PEPCID) IVPB 20 mg premix (has no administration in time range)    ED Course/ Medical Decision Making/ A&P                           Medical Decision Making Risk Prescription drug management.   Presents to the emergency department with diffuse rash consistent with urticaria after being stung by a bee.  She does have a history of minor allergic reactions to bee stings.  Patient does not have any findings that would be concerning for anaphylaxis.  Treated as acute allergic reaction and has done well.  Discharge with continued treatment, EpiPen for future use.        Final Clinical Impression(s) / ED Diagnoses Final diagnoses:  Bee sting reaction, accidental or unintentional, initial encounter    Rx / DC Orders ED Discharge Orders          Ordered    methylPREDNISolone (MEDROL DOSEPAK) 4 MG TBPK tablet        06/16/22 0327    EPINEPHrine 0.3 mg/0.3 mL IJ SOAJ injection  As needed        06/16/22 0327              Gilda Crease, MD 06/17/22 [phone]

## 2022-06-16 NOTE — Discharge Instructions (Addendum)
Continue to take Benadryl every 6 hours for the next 2 days then as needed for any recurrent rash.

## 2022-06-16 NOTE — ED Triage Notes (Addendum)
Pt states she was stung by a bee on the top of her R foot yesterday around 1pm. Pt states she took Benadryl around 2300 last night because she noticed her R foot was starting to swell. Pt woke up this morning around 1:30 itching and feeling like she was going to vomit. Pt denies any difficulty breathing. Pt with generalized rash to trunk and abdomen.

## 2022-06-20 ENCOUNTER — Other Ambulatory Visit (HOSPITAL_COMMUNITY): Payer: Self-pay | Admitting: Internal Medicine

## 2022-06-20 DIAGNOSIS — R921 Mammographic calcification found on diagnostic imaging of breast: Secondary | ICD-10-CM

## 2022-06-21 ENCOUNTER — Ambulatory Visit (HOSPITAL_COMMUNITY)
Admission: RE | Admit: 2022-06-21 | Discharge: 2022-06-21 | Disposition: A | Discharge status: Home / Self Care | Payer: PRIVATE HEALTH INSURANCE | Source: Ambulatory Visit | Attending: Cardiovascular Disease | Admitting: Cardiovascular Disease

## 2022-06-21 ENCOUNTER — Other Ambulatory Visit (HOSPITAL_COMMUNITY): Payer: Self-pay

## 2022-06-21 DIAGNOSIS — R072 Precordial pain: Secondary | ICD-10-CM | POA: Diagnosis present

## 2022-06-21 LAB — ECHOCARDIOGRAM COMPLETE
AR max vel: 2.21 cm2
AV Area VTI: 2.06 cm2
AV Area mean vel: 1.99 cm2
AV Mean grad: 6 mmHg
AV Peak grad: 11.4 mmHg
Ao pk vel: 1.69 m/s
Area-P 1/2: 3.37 cm2
Calc EF: 56.4 %
MV VTI: 1.94 cm2
S' Lateral: 2.8 cm
Single Plane A2C EF: 65.3 %
Single Plane A4C EF: 50.5 %

## 2022-06-21 NOTE — Progress Notes (Signed)
*  PRELIMINARY RESULTS* Echocardiogram 2D Echocardiogram has been performed.  Nancy Barrett 06/21/2022, 1:59 PM

## 2022-07-03 ENCOUNTER — Ambulatory Visit (INDEPENDENT_AMBULATORY_CARE_PROVIDER_SITE_OTHER): Payer: PRIVATE HEALTH INSURANCE | Admitting: Adult Health

## 2022-07-03 ENCOUNTER — Other Ambulatory Visit (HOSPITAL_COMMUNITY)
Admission: RE | Admit: 2022-07-03 | Discharge: 2022-07-03 | Disposition: A | Discharge status: Home / Self Care | Payer: PRIVATE HEALTH INSURANCE | Source: Ambulatory Visit | Attending: Adult Health | Admitting: Adult Health

## 2022-07-03 ENCOUNTER — Encounter: Payer: Self-pay | Admitting: Adult Health

## 2022-07-03 VITALS — BP 115/69 | HR 76 | Ht 64.5 in | Wt 143.0 lb

## 2022-07-03 DIAGNOSIS — Z01419 Encounter for gynecological examination (general) (routine) without abnormal findings: Secondary | ICD-10-CM | POA: Insufficient documentation

## 2022-07-03 DIAGNOSIS — Z1211 Encounter for screening for malignant neoplasm of colon: Secondary | ICD-10-CM | POA: Insufficient documentation

## 2022-07-03 LAB — HEMOCCULT GUIAC POC 1CARD (OFFICE): Fecal Occult Blood, POC: NEGATIVE

## 2022-07-03 NOTE — Progress Notes (Signed)
Patient ID: Nancy Barrett, female   DOB: [birth year], 56 y.o.   MRN: [id number] History of Present Illness: Nancy Barrett is a 56 year old white female, married, PM in for a well woman gyn exam and pap. She has some vaginal dryness at times. Has had some heart flutters and had echo and seen cardiologist. Has decreased energy, but stopped caffeine. She is self employed in a nursery business.  PCP is Dr Jimmye Norman at [facility].   Current Medications, Allergies, Past Medical History, Past Surgical History, Family History and Social History were reviewed in [employer] record.     Review of Systems: Patient denies any headaches, hearing loss, blurred vision, shortness of breath, chest pain, abdominal pain, problems with bowel movements, urination, or intercourse. No joint pain or mood swings.  See HPI for positives.    Physical Exam:BP 115/69 (BP Location: Left Arm, Patient Position: Sitting, Cuff Size: Normal)   Pulse 76   Ht 5' 4.5" (1.638 m)   Wt 143 lb (64.9 kg)   LMP 12/15/2017   BMI 24.17 kg/m   General:  Well developed, well nourished, no acute distress Skin:  Warm and dry Neck:  Midline trachea, normal thyroid, good ROM, no lymphadenopathy Lungs; Clear to auscultation bilaterally Breast:  No dominant palpable mass, retraction, or nipple discharge Cardiovascular: Regular rate and rhythm Abdomen:  Soft, non tender, no hepatosplenomegaly Pelvic:  External genitalia is normal in appearance, no lesions.  The vagina is pale with loss of moisture and rugae. Urethra has no lesions or masses. The cervix is smooth, pap with HR HPV genotyping performed.   Uterus is felt to be normal size, shape, and contour.  No adnexal masses or tenderness noted.Bladder is non tender, no masses felt. Rectal: Good sphincter tone, no polyps, or hemorrhoids felt.  Hemoccult negative. Extremities/musculoskeletal:  No swelling or varicosities noted, no clubbing or cyanosis Psych:  No mood changes, alert  and cooperative,seems happy She declined AA, PHQ 9 and GAD Fall risk is low  Upstream - 07/03/22 1538       Pregnancy Intention Screening   Does the patient want to become pregnant in the next year? No    Does the patient's partner want to become pregnant in the next year? No    Would the patient like to discuss contraceptive options today? No      Contraception Wrap Up   Current Method Female Sterilization    End Method Female Sterilization    Contraception Counseling Provided No            Examination chaperoned by Diona Fanti CMA  Impression and Plan: 1. Encounter for gynecological examination with Papanicolaou smear of cervix Pap sent Pap in 3 years if normal Physical with PCP Labs with PCP and she brought copy will can in cholesterol is elevated Mammogram 07/09/22 Colonoscopy per GI   2. Encounter for screening fecal occult blood testing Hemoccult was negative

## 2022-07-04 ENCOUNTER — Ambulatory Visit: Payer: Self-pay | Admitting: Gastroenterology

## 2022-07-09 ENCOUNTER — Encounter: Payer: Self-pay | Admitting: Adult Health

## 2022-07-09 ENCOUNTER — Ambulatory Visit (HOSPITAL_COMMUNITY)
Admission: RE | Admit: 2022-07-09 | Discharge: 2022-07-09 | Disposition: A | Discharge status: Home / Self Care | Payer: PRIVATE HEALTH INSURANCE | Source: Ambulatory Visit | Attending: Internal Medicine | Admitting: Internal Medicine

## 2022-07-09 ENCOUNTER — Telehealth: Payer: Self-pay | Admitting: Adult Health

## 2022-07-09 ENCOUNTER — Encounter (HOSPITAL_COMMUNITY): Payer: Self-pay

## 2022-07-09 DIAGNOSIS — R921 Mammographic calcification found on diagnostic imaging of breast: Secondary | ICD-10-CM | POA: Insufficient documentation

## 2022-07-09 DIAGNOSIS — R87610 Atypical squamous cells of undetermined significance on cytologic smear of cervix (ASC-US): Secondary | ICD-10-CM | POA: Insufficient documentation

## 2022-07-09 HISTORY — DX: Atypical squamous cells of undetermined significance on cytologic smear of cervix (ASC-US): R87.610

## 2022-07-09 LAB — CYTOLOGY - PAP
Comment: NEGATIVE
Comment: NEGATIVE
Comment: NEGATIVE
Diagnosis: UNDETERMINED — AB
HPV 16: NEGATIVE
HPV 18 / 45: NEGATIVE
High risk HPV: POSITIVE — AB

## 2022-07-09 NOTE — Telephone Encounter (Signed)
Pt aware of pap and need for colpo, appt to be made

## 2022-07-11 ENCOUNTER — Ambulatory Visit: Payer: Self-pay | Admitting: Internal Medicine

## 2022-07-16 ENCOUNTER — Telehealth: Payer: Self-pay | Admitting: Cardiovascular Disease

## 2022-07-16 ENCOUNTER — Other Ambulatory Visit: Payer: Self-pay

## 2022-07-16 ENCOUNTER — Encounter: Payer: Self-pay | Admitting: Obstetrics & Gynecology

## 2022-07-16 ENCOUNTER — Ambulatory Visit (INDEPENDENT_AMBULATORY_CARE_PROVIDER_SITE_OTHER): Payer: PRIVATE HEALTH INSURANCE | Admitting: Obstetrics & Gynecology

## 2022-07-16 VITALS — BP 148/84 | HR 74 | Wt 144.0 lb

## 2022-07-16 DIAGNOSIS — R87610 Atypical squamous cells of undetermined significance on cytologic smear of cervix (ASC-US): Secondary | ICD-10-CM

## 2022-07-16 DIAGNOSIS — R87810 Cervical high risk human papillomavirus (HPV) DNA test positive: Secondary | ICD-10-CM

## 2022-07-16 NOTE — Progress Notes (Signed)
    Colposcopy Procedure Note:    Colposcopy Procedure Note  Indications:  2023 ASCUS +HPV(neg 16+18) 2019 Normal cytology +HPV(untyped)   2019 ASCCP recommendation:  Smoker:  No. New sexual partner:  No.  : time frame:    History of abnormal Pap: yes  Procedure Details  The risks and benefits of the procedure and Written informed consent obtained.  Speculum placed in vagina and excellent visualization of cervix achieved, cervix swabbed x 3 with acetic acid solution.  Findings: Adequate colposcopy is noted today.  Cervix: no visible lesions, no mosaicism, no punctation, and no abnormal vasculature; SCJ visualized 360 degrees without lesions and no biopsies taken. Vaginal inspection: vaginal colposcopy not performed. Vulvar colposcopy: vulvar colposcopy not performed.  Specimens: none  Complications: none.  Colposcopic Impression: Normal colposcopy  Plan(Based on 2019 ASCCP recommendations) Follow up HPV based cytology 1year

## 2022-07-16 NOTE — Telephone Encounter (Signed)
Noted. Thanks.

## 2022-07-16 NOTE — Telephone Encounter (Signed)
Pt called stating she saw the results of her echo in [place], she wants to know are any other tests needed. Please advise.

## 2022-07-16 NOTE — Telephone Encounter (Signed)
Patient wanted to go over her July 2023 echo results. Done. She stated that due to a rash, her PCP changed her from Crestor '10mg'$  daily to atorvastatin '10mg'$  daily.

## 2022-07-18 ENCOUNTER — Encounter (HOSPITAL_COMMUNITY): Payer: Self-pay

## 2022-07-18 NOTE — Telephone Encounter (Signed)
CCTA request sent to central scheduling.

## 2022-08-07 ENCOUNTER — Telehealth (HOSPITAL_COMMUNITY): Payer: Self-pay | Admitting: *Deleted

## 2022-08-07 ENCOUNTER — Other Ambulatory Visit (HOSPITAL_COMMUNITY): Payer: Self-pay | Admitting: *Deleted

## 2022-08-07 MED ORDER — METOPROLOL TARTRATE 100 MG PO TABS: ORAL_TABLET | ORAL | 0 refills | Status: DC

## 2022-08-07 NOTE — Telephone Encounter (Signed)
Reaching out to patient to offer assistance regarding upcoming cardiac imaging study; pt verbalizes understanding of appt date/time, parking situation and where to check in, pre-test NPO status and medications ordered, and verified current allergies; name and call back number provided for further questions should they arise  Courtlynn Holloman RN Navigator Cardiac Imaging [facility] [phone] office [phone] cell  Patient to take 100mg metoprolol tartrate two hours prior to her cardiac CT scan. She is aware to arrive at 9am. 

## 2022-08-08 ENCOUNTER — Ambulatory Visit (HOSPITAL_COMMUNITY)
Admission: RE | Admit: 2022-08-08 | Discharge: 2022-08-08 | Disposition: A | Discharge status: Home / Self Care | Payer: PRIVATE HEALTH INSURANCE | Source: Ambulatory Visit | Attending: Cardiovascular Disease | Admitting: Cardiovascular Disease

## 2022-08-08 DIAGNOSIS — R072 Precordial pain: Secondary | ICD-10-CM

## 2022-08-08 MED ORDER — IOHEXOL 350 MG/ML SOLN
100.0000 mL | Freq: Once | INTRAVENOUS | Status: AC | PRN
Start: 2022-08-08 — End: 2022-08-08
  Administered 2022-08-08: 100 mL via INTRAVENOUS

## 2022-08-08 MED ORDER — NITROGLYCERIN 0.4 MG SL SUBL: 0.8000 mg | SUBLINGUAL_TABLET | Freq: Once | SUBLINGUAL | Status: AC

## 2022-08-08 MED ORDER — NITROGLYCERIN 0.4 MG SL SUBL
SUBLINGUAL_TABLET | SUBLINGUAL | Status: AC
  Administered 2022-08-08: 0.8 mg via SUBLINGUAL
  Filled 2022-08-08: qty 2

## 2022-09-26 ENCOUNTER — Ambulatory Visit (INDEPENDENT_AMBULATORY_CARE_PROVIDER_SITE_OTHER): Payer: PRIVATE HEALTH INSURANCE | Admitting: Internal Medicine

## 2022-09-26 ENCOUNTER — Telehealth: Payer: Self-pay | Admitting: *Deleted

## 2022-09-26 ENCOUNTER — Encounter: Payer: Self-pay | Admitting: *Deleted

## 2022-09-26 ENCOUNTER — Encounter: Payer: Self-pay | Admitting: Internal Medicine

## 2022-09-26 VITALS — BP 139/79 | HR 74 | Temp 97.9°F | Ht 65.0 in | Wt 148.2 lb

## 2022-09-26 DIAGNOSIS — D123 Benign neoplasm of transverse colon: Secondary | ICD-10-CM | POA: Diagnosis not present

## 2022-09-26 MED ORDER — PEG 3350-KCL-NA BICARB-NACL 420 G PO SOLR: 4000.0000 mL | Freq: Once | ORAL | 0 refills | Status: AC

## 2022-09-26 NOTE — Patient Instructions (Signed)
We will schedule you for colonoscopy given your history of polyps.  Further recommendations to follow.  It was very nice meeting you today.  Dr. Abbey Chatters

## 2022-09-26 NOTE — Telephone Encounter (Signed)
PA faxed to [facility].

## 2022-09-26 NOTE — Progress Notes (Signed)
Primary Care Physician:  [facility] [place], MD Primary Gastroenterologist:  Dr. Abbey Chatters  Chief Complaint  Patient presents with   New Patient (Initial Visit)    Pt here for colonoscopy visit    HPI:   Nancy Barrett is a 56 y.o. female who presents to clinic today by referral from her PCP Dr. Jimmye Norman to discuss colonoscopy.  Last colonoscopy 2020 with 3 tubular adenomas removed.  Patient is notes family history of colon cancer in her great grandfather and uncle.  No melena hematochezia.  No abdominal pain.  No unintentional weight loss.  Denies any upper GI symptoms including heartburn, reflux, dysphagia/odynophagia, epigastric chest pain, nausea.  Past Medical History:  Diagnosis Date   Anemia    ASCUS with positive high risk HPV cervical 07/09/2022   07/2022 colposcopy per ASCCP guidelines immediate CIN 3+ risk is 5.4%   Asthma    Bronchitis    Cervical high risk human papillomavirus (HPV) DNA test positive 03/02/2018   Fibroids 12/26/2014   Headache    High cholesterol    Hot flashes 12/15/2014   Irregular periods 12/15/2014   Melanoma ([place])    nose, removed   Numbness in both hands    Ovarian cyst 12/26/2014   Peri-menopause 12/15/2014   Thickened endometrium 03/03/2018   ?polyp, will get endo bx    Past Surgical History:  Procedure Laterality Date   COLONOSCOPY N/A 02/08/2019   Procedure: COLONOSCOPY;  Surgeon: Danie Binder, MD;  Location: [facility];  Service: Endoscopy;  Laterality: N/A;  9:30   PARATHYROIDECTOMY N/A 07/17/2018   Procedure: MINIMALLY INVASIVE PARATHYROIDECTOMY ERAS PATHWAY;  Surgeon: Coralie Keens, MD;  Location: [facility];  Service: General;  Laterality: N/A;   POLYPECTOMY  02/08/2019   Procedure: POLYPECTOMY;  Surgeon: Danie Binder, MD;  Location: [facility];  Service: Endoscopy;;   SKIN CANCER EXCISION  2014   removed from nose   TUBAL LIGATION      Current Outpatient Medications  Medication Sig Dispense Refill   acetaminophen  (TYLENOL) 500 MG tablet Take 1,000 mg by mouth every 6 (six) hours as needed (for pain.).     aspirin EC 81 MG tablet Take 81 mg by mouth daily. Swallow whole.     ferrous sulfate 324 MG TBEC Take 324 mg by mouth.     EPINEPHrine 0.3 mg/0.3 mL IJ SOAJ injection Inject 0.3 mg into the muscle as needed for anaphylaxis. (Patient not taking: Reported on 07/16/2022) 1 each 0   metoprolol tartrate (LOPRESSOR) 100 MG tablet Take tablet ('100mg'$ ) TWO hours prior to your cardiac CT scan. (Patient not taking: Reported on 09/26/2022) 1 tablet 0   rosuvastatin (CRESTOR) 10 MG tablet Take 10 mg by mouth at bedtime. (Patient not taking: Reported on 09/26/2022)     No current facility-administered medications for this visit.    Allergies as of 09/26/2022 - Review Complete 09/26/2022  Allergen Reaction Noted   Sulfa antibiotics  10/26/2018   Amoxicillin Hives 03/31/2015   Penicillins Hives 03/31/2015    Family History  Problem Relation Age of Onset   Stroke Paternal Grandfather    Heart attack Paternal Grandmother    Other Maternal Grandfather        stomach problems   Cancer Maternal Grandfather        colon cancer   Heart attack Father 63   Bronchitis Mother    Arthritis Mother    Hypertension Mother    Hyperlipidemia Mother    Heart  disease Mother    Hypertension Brother    Hypertension Brother    Hypertension Brother    Cancer Maternal Uncle        colon cancer   Colon cancer Maternal Uncle    Heart attack Paternal Uncle    Colon cancer Maternal Great-grandfather    Colon polyps Neg Hx     Social History   Socioeconomic History   Marital status: Married    Spouse name: Not on file   Number of children: 3   Years of education: Not on file   Highest education level: Not on file  Occupational History   Occupation: Nursery Owner  Tobacco Use   Smoking status: Never   Smokeless tobacco: Never  Vaping Use   Vaping Use: Never used  Substance and Sexual Activity   Alcohol use: Yes     Comment: rarely   Drug use: No   Sexual activity: Yes    Birth control/protection: Surgical    Comment: tubal  Other Topics Concern   Not on file  Social History Narrative   WATCHES 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   Social Determinants of Health   Financial Resource Strain: Unknown (07/03/2022)   Overall Financial Resource Strain (CARDIA)    Difficulty of Paying Living Expenses: Patient refused  Food Insecurity: Unknown (07/03/2022)   Hunger Vital Sign    Worried About Running Out of Food in the Last Year: Patient refused    [place] in the Last Year: Patient refused  Transportation Needs: Unknown (07/03/2022)   PRAPARE - Hydrologist (Medical): Patient refused    Lack of Transportation (Non-Medical): Patient refused  Physical Activity: Unknown (07/03/2022)   Exercise Vital Sign    Days of Exercise per Week: Patient refused    Minutes of Exercise per Session: Patient refused  Stress: Unknown (07/03/2022)   [place]    Feeling of Stress : Patient refused  Social Connections: Unknown (07/03/2022)   Social Connection and Isolation Panel [NHANES]    Frequency of Communication with Friends and Family: Patient refused    Frequency of Social Gatherings with Friends and Family: Patient refused    Attends Religious Services: Patient refused    Active Member of Clubs or Organizations: Patient refused    Attends Archivist Meetings: Patient refused    Marital Status: Patient refused  Intimate Partner Violence: Unknown (07/03/2022)   Humiliation, Afraid, Rape, and Kick questionnaire    Fear of Current or Ex-Partner: Patient refused    Emotionally Abused: Patient refused    Physically Abused: Patient refused    Sexually Abused: Patient refused    Subjective: Review of Systems  Constitutional:  Negative for chills and fever.  HENT:  Negative for  congestion and hearing loss.   Eyes:  Negative for blurred vision and double vision.  Respiratory:  Negative for cough and shortness of breath.   Cardiovascular:  Negative for chest pain and palpitations.  Gastrointestinal:  Negative for abdominal pain, blood in stool, constipation, diarrhea, heartburn, melena and vomiting.  Genitourinary:  Negative for dysuria and urgency.  Musculoskeletal:  Negative for joint pain and myalgias.  Skin:  Negative for itching and rash.  Neurological:  Negative for dizziness and headaches.  Psychiatric/Behavioral:  Negative for depression. The patient is not nervous/anxious.        Objective: BP 139/79   Pulse 74   Temp 97.9 F (36.6 C)  Ht '5\' 5"'$  (1.651 m)   Wt 148 lb 3.2 oz (67.2 kg)   LMP 12/15/2017   BMI 24.66 kg/m  Physical Exam Constitutional:      Appearance: Normal appearance.  HENT:     Head: Normocephalic and atraumatic.  Eyes:     Extraocular Movements: Extraocular movements intact.     Conjunctiva/sclera: Conjunctivae normal.  Cardiovascular:     Rate and Rhythm: Normal rate and regular rhythm.  Pulmonary:     Effort: Pulmonary effort is normal.     Breath sounds: Normal breath sounds.  Abdominal:     General: Bowel sounds are normal.     Palpations: Abdomen is soft.  Musculoskeletal:        General: No swelling. Normal range of motion.     Cervical back: Normal range of motion and neck supple.  Skin:    General: Skin is warm and dry.     Coloration: Skin is not jaundiced.  Neurological:     General: No focal deficit present.     Mental Status: She is alert and oriented to person, place, and time.  Psychiatric:        Mood and Affect: Mood normal.        Behavior: Behavior normal.      Assessment: *Adenomatous colon polyps  Plan: Will schedule for surveillance colonoscopy.The risks including infection, bleed, or perforation as well as benefits, limitations, alternatives and imponderables have been reviewed with  the patient. Questions have been answered. All parties agreeable.   09/26/2022 2:28 PM   Disclaimer: This note was dictated with voice recognition software. Similar sounding words can inadvertently be transcribed and may not be corrected upon review.

## 2022-10-16 ENCOUNTER — Telehealth: Payer: Self-pay | Admitting: *Deleted

## 2022-10-16 NOTE — Telephone Encounter (Signed)
Patient called in and needs to cancel procedure for 11/21 for now. Procedure cancelled. Endo aware

## 2022-10-22 ENCOUNTER — Ambulatory Visit (HOSPITAL_COMMUNITY): Admission: RE | Admit: 2022-10-22 | Payer: PRIVATE HEALTH INSURANCE | Source: Ambulatory Visit

## 2022-10-22 ENCOUNTER — Encounter (HOSPITAL_COMMUNITY): Admission: RE | Payer: Self-pay | Source: Ambulatory Visit

## 2022-10-22 SURGERY — COLONOSCOPY WITH PROPOFOL
Anesthesia: Monitor Anesthesia Care

## 2024-12-11 ENCOUNTER — Encounter (HOSPITAL_COMMUNITY): Payer: Self-pay

## 2024-12-11 ENCOUNTER — Emergency Department (HOSPITAL_COMMUNITY)

## 2024-12-11 ENCOUNTER — Emergency Department (HOSPITAL_COMMUNITY)
Admission: EM | Admit: 2024-12-11 | Discharge: 2024-12-11 | Disposition: A | Discharge status: Home / Self Care | Attending: Emergency Medicine | Admitting: Emergency Medicine

## 2024-12-11 DIAGNOSIS — S299XXA Unspecified injury of thorax, initial encounter: Secondary | ICD-10-CM | POA: Insufficient documentation

## 2024-12-11 DIAGNOSIS — Y92410 Unspecified street and highway as the place of occurrence of the external cause: Secondary | ICD-10-CM | POA: Diagnosis not present

## 2024-12-11 DIAGNOSIS — S199XXA Unspecified injury of neck, initial encounter: Secondary | ICD-10-CM | POA: Diagnosis present

## 2024-12-11 DIAGNOSIS — M25511 Pain in right shoulder: Secondary | ICD-10-CM | POA: Insufficient documentation

## 2024-12-11 DIAGNOSIS — S161XXA Strain of muscle, fascia and tendon at neck level, initial encounter: Secondary | ICD-10-CM | POA: Insufficient documentation

## 2024-12-11 MED ORDER — NAPROXEN 500 MG PO TABS: 500.0000 mg | ORAL_TABLET | Freq: Two times a day (BID) | ORAL | 0 refills | Status: AC

## 2024-12-11 MED ORDER — ACETAMINOPHEN 500 MG PO TABS
1000.0000 mg | ORAL_TABLET | Freq: Once | ORAL | Status: AC
  Administered 2024-12-11: 1000 mg via ORAL
  Filled 2024-12-11: qty 2

## 2024-12-11 NOTE — ED Notes (Signed)
 Pt/family received d/c paperwork at this time. After going over the paperwork any questions, comments, or concerns were answered to the best of this nurse's knowledge. The pt/family verbally acknowledged the teachings/instructions.
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
## 2024-12-13 ENCOUNTER — Other Ambulatory Visit: Payer: Self-pay

## 2024-12-13 ENCOUNTER — Encounter (HOSPITAL_BASED_OUTPATIENT_CLINIC_OR_DEPARTMENT_OTHER): Payer: Self-pay

## 2024-12-13 ENCOUNTER — Emergency Department (HOSPITAL_BASED_OUTPATIENT_CLINIC_OR_DEPARTMENT_OTHER)
Admission: EM | Admit: 2024-12-13 | Discharge: 2024-12-13 | Disposition: A | Discharge status: Home / Self Care | Payer: Self-pay

## 2024-12-13 ENCOUNTER — Emergency Department (INDEPENDENT_AMBULATORY_CARE_PROVIDER_SITE_OTHER): Payer: Self-pay

## 2024-12-13 DIAGNOSIS — G44319 Acute post-traumatic headache, not intractable: Secondary | ICD-10-CM | POA: Insufficient documentation

## 2024-12-13 DIAGNOSIS — Z7982 Long term (current) use of aspirin: Secondary | ICD-10-CM | POA: Insufficient documentation

## 2024-12-13 DIAGNOSIS — J3489 Other specified disorders of nose and nasal sinuses: Secondary | ICD-10-CM

## 2024-12-13 LAB — COMPREHENSIVE METABOLIC PANEL WITH GFR
ALT: 12 U/L (ref 0–44)
AST: 17 U/L (ref 15–41)
Albumin: 4.4 g/dL (ref 3.5–5.0)
Alkaline Phosphatase: 99 U/L (ref 38–126)
Anion gap: 9 (ref 5–15)
BUN: 11 mg/dL (ref 6–20)
CO2: 28 mmol/L (ref 22–32)
Calcium: 9.7 mg/dL (ref 8.9–10.3)
Chloride: 103 mmol/L (ref 98–111)
Creatinine, Ser: 0.74 mg/dL (ref 0.44–1.00)
GFR, Estimated: >60 mL/min
Glucose, Bld: 94 mg/dL (ref 70–99)
Potassium: 4.1 mmol/L (ref 3.5–5.1)
Sodium: 140 mmol/L (ref 135–145)
Total Bilirubin: 0.7 mg/dL (ref 0.0–1.2)
Total Protein: 7 g/dL (ref 6.5–8.1)

## 2024-12-13 LAB — MAGNESIUM: Magnesium: 2.1 mg/dL (ref 1.7–2.4)

## 2024-12-13 LAB — CBC
HCT: 42 % (ref 36.0–46.0)
Hemoglobin: 14 g/dL (ref 12.0–15.0)
MCH: 29.5 pg (ref 26.0–34.0)
MCHC: 33.3 g/dL (ref 30.0–36.0)
MCV: 88.4 fL (ref 80.0–100.0)
Platelets: 214 K/uL (ref 150–400)
RBC: 4.75 MIL/uL (ref 3.87–5.11)
RDW: 12.4 % (ref 11.5–15.5)
WBC: 5.2 K/uL (ref 4.0–10.5)
nRBC: 0 % (ref 0.0–0.2)

## 2024-12-13 MED ORDER — LACTATED RINGERS IV BOLUS
1000.0000 mL | Freq: Once | INTRAVENOUS | Status: AC
  Administered 2024-12-13: 1000 mL via INTRAVENOUS

## 2024-12-13 MED ORDER — DIPHENHYDRAMINE HCL 25 MG PO CAPS
25.0000 mg | ORAL_CAPSULE | Freq: Once | ORAL | Status: AC
  Administered 2024-12-13: 25 mg via ORAL
  Filled 2024-12-13: qty 1

## 2024-12-13 MED ORDER — KETOROLAC TROMETHAMINE 15 MG/ML IJ SOLN
15.0000 mg | Freq: Once | INTRAMUSCULAR | Status: AC
  Administered 2024-12-13: 15 mg via INTRAVENOUS
  Filled 2024-12-13: qty 1

## 2024-12-13 MED ORDER — DEXAMETHASONE SOD PHOSPHATE PF 10 MG/ML IJ SOLN
8.0000 mg | Freq: Once | INTRAMUSCULAR | Status: AC
  Administered 2024-12-13: 8 mg via INTRAVENOUS
  Filled 2024-12-13: qty 1

## 2024-12-13 MED ORDER — METOCLOPRAMIDE HCL 5 MG/ML IJ SOLN
10.0000 mg | Freq: Once | INTRAMUSCULAR | Status: AC
  Administered 2024-12-13: 10 mg via INTRAVENOUS
  Filled 2024-12-13: qty 2
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
## 2024-12-13 NOTE — ED Notes (Signed)
 Patient transported to CT

## 2024-12-13 NOTE — Discharge Instructions (Signed)
 As we discussed, you can follow up with your primary care provided if needed for any ongoing symptoms. Return to the ED with any new or worsening symptoms, or for new concern.
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
## 2025-01-04 ENCOUNTER — Telehealth: Payer: Self-pay | Admitting: Family Medicine

## 2025-01-04 NOTE — Telephone Encounter (Signed)
 Patient has been scheduled for hospital follow up on 01/14/2025

## 2025-01-14 ENCOUNTER — Inpatient Hospital Stay: Payer: Self-pay | Admitting: Family Medicine
